# Patient Record
Sex: Female | Born: 1950 | Race: White | Hispanic: No | Marital: Married
Health system: Southern US, Community
[De-identification: ages and names within clinical notes are randomized; demographics above are authoritative.]

---

## 2003-11-26 ENCOUNTER — Ambulatory Visit: Payer: Self-pay | Admitting: Internal Medicine

## 2005-04-01 ENCOUNTER — Ambulatory Visit: Payer: Self-pay | Admitting: Internal Medicine

## 2006-04-10 ENCOUNTER — Ambulatory Visit: Payer: Self-pay | Admitting: Internal Medicine

## 2006-04-13 ENCOUNTER — Ambulatory Visit: Payer: Self-pay | Admitting: Internal Medicine

## 2007-06-27 ENCOUNTER — Ambulatory Visit: Payer: Self-pay | Admitting: Internal Medicine

## 2009-02-23 ENCOUNTER — Ambulatory Visit: Payer: Self-pay | Admitting: Internal Medicine

## 2009-03-26 ENCOUNTER — Ambulatory Visit: Payer: Self-pay | Admitting: Gastroenterology

## 2011-03-08 ENCOUNTER — Ambulatory Visit: Payer: Self-pay | Admitting: Internal Medicine

## 2012-03-20 ENCOUNTER — Ambulatory Visit: Payer: Self-pay | Admitting: Internal Medicine

## 2013-04-24 ENCOUNTER — Ambulatory Visit: Payer: Self-pay | Admitting: Otolaryngology

## 2013-04-25 ENCOUNTER — Ambulatory Visit: Payer: Self-pay | Admitting: Oncology

## 2013-04-25 LAB — CBC CANCER CENTER
BASOS PCT: 0.5 %
Basophil #: 0 x10 3/mm (ref 0.0–0.1)
EOS ABS: 0.1 x10 3/mm (ref 0.0–0.7)
Eosinophil %: 0.9 %
HCT: 39.1 % (ref 35.0–47.0)
HGB: 12.8 g/dL (ref 12.0–16.0)
LYMPHS PCT: 23.6 %
Lymphocyte #: 2 x10 3/mm (ref 1.0–3.6)
MCH: 28.2 pg (ref 26.0–34.0)
MCHC: 32.8 g/dL (ref 32.0–36.0)
MCV: 86 fL (ref 80–100)
MONOS PCT: 7.3 %
Monocyte #: 0.6 x10 3/mm (ref 0.2–0.9)
NEUTROS PCT: 67.7 %
Neutrophil #: 5.7 x10 3/mm (ref 1.4–6.5)
Platelet: 389 x10 3/mm (ref 150–440)
RBC: 4.55 10*6/uL (ref 3.80–5.20)
RDW: 15 % — ABNORMAL HIGH (ref 11.5–14.5)
WBC: 8.4 x10 3/mm (ref 3.6–11.0)

## 2013-04-25 LAB — COMPREHENSIVE METABOLIC PANEL
ALBUMIN: 3.6 g/dL (ref 3.4–5.0)
ALK PHOS: 96 U/L
ALT: 19 U/L (ref 12–78)
Anion Gap: 10 (ref 7–16)
BILIRUBIN TOTAL: 0.5 mg/dL (ref 0.2–1.0)
BUN: 14 mg/dL (ref 7–18)
CALCIUM: 10.2 mg/dL — AB (ref 8.5–10.1)
CREATININE: 1.16 mg/dL (ref 0.60–1.30)
Chloride: 102 mmol/L (ref 98–107)
Co2: 30 mmol/L (ref 21–32)
EGFR (African American): 59 — ABNORMAL LOW
EGFR (Non-African Amer.): 51 — ABNORMAL LOW
Glucose: 99 mg/dL (ref 65–99)
Osmolality: 284 (ref 275–301)
POTASSIUM: 3.6 mmol/L (ref 3.5–5.1)
SGOT(AST): 16 U/L (ref 15–37)
Sodium: 142 mmol/L (ref 136–145)
Total Protein: 7.8 g/dL (ref 6.4–8.2)

## 2013-04-26 LAB — CEA: CEA: 32.8 ng/mL — ABNORMAL HIGH (ref 0.0–4.7)

## 2013-04-29 ENCOUNTER — Ambulatory Visit: Payer: Self-pay | Admitting: Oncology

## 2013-05-02 LAB — CBC CANCER CENTER
Basophil #: 0 x10 3/mm (ref 0.0–0.1)
Basophil %: 0.1 %
EOS ABS: 0 x10 3/mm (ref 0.0–0.7)
EOS PCT: 0 %
HCT: 37.8 % (ref 35.0–47.0)
HGB: 12.2 g/dL (ref 12.0–16.0)
Lymphocyte #: 1.3 x10 3/mm (ref 1.0–3.6)
Lymphocyte %: 7.3 %
MCH: 28 pg (ref 26.0–34.0)
MCHC: 32.2 g/dL (ref 32.0–36.0)
MCV: 87 fL (ref 80–100)
Monocyte #: 0.7 x10 3/mm (ref 0.2–0.9)
Monocyte %: 3.9 %
NEUTROS ABS: 16.1 x10 3/mm — AB (ref 1.4–6.5)
Neutrophil %: 88.7 %
Platelet: 424 x10 3/mm (ref 150–440)
RBC: 4.36 10*6/uL (ref 3.80–5.20)
RDW: 15 % — AB (ref 11.5–14.5)
WBC: 18.2 x10 3/mm — AB (ref 3.6–11.0)

## 2013-05-06 ENCOUNTER — Ambulatory Visit: Payer: Self-pay

## 2013-05-06 LAB — PROTIME-INR
INR: 1
PROTHROMBIN TIME: 12.8 s (ref 11.5–14.7)

## 2013-05-06 LAB — APTT: Activated PTT: 25.7 secs (ref 23.6–35.9)

## 2013-05-15 LAB — CBC CANCER CENTER
Basophil #: 0.1 x10 3/mm (ref 0.0–0.1)
Basophil %: 0.7 %
EOS PCT: 0 %
Eosinophil #: 0 x10 3/mm (ref 0.0–0.7)
HCT: 36.5 % (ref 35.0–47.0)
HGB: 11.9 g/dL — ABNORMAL LOW (ref 12.0–16.0)
Lymphocyte #: 0.8 x10 3/mm — ABNORMAL LOW (ref 1.0–3.6)
Lymphocyte %: 5.6 %
MCH: 28.4 pg (ref 26.0–34.0)
MCHC: 32.6 g/dL (ref 32.0–36.0)
MCV: 87 fL (ref 80–100)
Monocyte #: 0.5 x10 3/mm (ref 0.2–0.9)
Monocyte %: 3.8 %
NEUTROS PCT: 89.9 %
Neutrophil #: 12.5 x10 3/mm — ABNORMAL HIGH (ref 1.4–6.5)
PLATELETS: 295 x10 3/mm (ref 150–440)
RBC: 4.2 10*6/uL (ref 3.80–5.20)
RDW: 16.9 % — ABNORMAL HIGH (ref 11.5–14.5)
WBC: 13.9 x10 3/mm — AB (ref 3.6–11.0)

## 2013-05-17 ENCOUNTER — Ambulatory Visit: Payer: Self-pay | Admitting: Oncology

## 2013-05-29 LAB — COMPREHENSIVE METABOLIC PANEL
Albumin: 3.2 g/dL — ABNORMAL LOW (ref 3.4–5.0)
Alkaline Phosphatase: 103 U/L
Anion Gap: 11 (ref 7–16)
BUN: 28 mg/dL — ABNORMAL HIGH (ref 7–18)
Bilirubin,Total: 0.5 mg/dL (ref 0.2–1.0)
CHLORIDE: 103 mmol/L (ref 98–107)
CO2: 28 mmol/L (ref 21–32)
CREATININE: 1.17 mg/dL (ref 0.60–1.30)
Calcium, Total: 8.8 mg/dL (ref 8.5–10.1)
EGFR (African American): 58 — ABNORMAL LOW
GFR CALC NON AF AMER: 50 — AB
GLUCOSE: 173 mg/dL — AB (ref 65–99)
Osmolality: 293 (ref 275–301)
Potassium: 3.5 mmol/L (ref 3.5–5.1)
SGOT(AST): 18 U/L (ref 15–37)
SGPT (ALT): 55 U/L (ref 12–78)
Sodium: 142 mmol/L (ref 136–145)
Total Protein: 6.6 g/dL (ref 6.4–8.2)

## 2013-05-29 LAB — CBC CANCER CENTER
Basophil #: 0.1 x10 3/mm (ref 0.0–0.1)
Basophil %: 0.4 %
EOS PCT: 0.3 %
Eosinophil #: 0.1 x10 3/mm (ref 0.0–0.7)
HCT: 39.6 % (ref 35.0–47.0)
HGB: 13 g/dL (ref 12.0–16.0)
Lymphocyte #: 1.1 x10 3/mm (ref 1.0–3.6)
Lymphocyte %: 6.2 %
MCH: 29.2 pg (ref 26.0–34.0)
MCHC: 32.9 g/dL (ref 32.0–36.0)
MCV: 89 fL (ref 80–100)
MONOS PCT: 4.2 %
Monocyte #: 0.8 x10 3/mm (ref 0.2–0.9)
Neutrophil #: 16.3 x10 3/mm — ABNORMAL HIGH (ref 1.4–6.5)
Neutrophil %: 88.9 %
Platelet: 325 x10 3/mm (ref 150–440)
RBC: 4.45 10*6/uL (ref 3.80–5.20)
RDW: 18.1 % — ABNORMAL HIGH (ref 11.5–14.5)
WBC: 18.3 x10 3/mm — AB (ref 3.6–11.0)

## 2013-05-31 LAB — PATHOLOGY REPORT

## 2013-06-05 LAB — CBC CANCER CENTER
BASOS ABS: 0 x10 3/mm (ref 0.0–0.1)
Basophil %: 0.2 %
EOS ABS: 0.2 x10 3/mm (ref 0.0–0.7)
EOS PCT: 2.6 %
HCT: 38.9 % (ref 35.0–47.0)
HGB: 12.9 g/dL (ref 12.0–16.0)
Lymphocyte #: 1.4 x10 3/mm (ref 1.0–3.6)
Lymphocyte %: 17.4 %
MCH: 29.1 pg (ref 26.0–34.0)
MCHC: 33 g/dL (ref 32.0–36.0)
MCV: 88 fL (ref 80–100)
MONO ABS: 0.4 x10 3/mm (ref 0.2–0.9)
MONOS PCT: 4.8 %
NEUTROS ABS: 6.2 x10 3/mm (ref 1.4–6.5)
NEUTROS PCT: 75 %
PLATELETS: 225 x10 3/mm (ref 150–440)
RBC: 4.42 10*6/uL (ref 3.80–5.20)
RDW: 18.1 % — ABNORMAL HIGH (ref 11.5–14.5)
WBC: 8.2 x10 3/mm (ref 3.6–11.0)

## 2013-06-07 LAB — CBC WITH DIFFERENTIAL/PLATELET
Basophil #: 0 10*3/uL (ref 0.0–0.1)
Basophil %: 0.1 %
EOS PCT: 0.4 %
Eosinophil #: 0 10*3/uL (ref 0.0–0.7)
HCT: 35.8 % (ref 35.0–47.0)
HGB: 12.1 g/dL (ref 12.0–16.0)
Lymphocyte #: 0.5 10*3/uL — ABNORMAL LOW (ref 1.0–3.6)
Lymphocyte %: 8.3 %
MCH: 29.7 pg (ref 26.0–34.0)
MCHC: 33.7 g/dL (ref 32.0–36.0)
MCV: 88 fL (ref 80–100)
Monocyte #: 0.7 x10 3/mm (ref 0.2–0.9)
Monocyte %: 11.6 %
Neutrophil #: 4.6 10*3/uL (ref 1.4–6.5)
Neutrophil %: 79.6 %
Platelet: 165 10*3/uL (ref 150–440)
RBC: 4.07 10*6/uL (ref 3.80–5.20)
RDW: 17.6 % — ABNORMAL HIGH (ref 11.5–14.5)
WBC: 5.7 10*3/uL (ref 3.6–11.0)

## 2013-06-07 LAB — COMPREHENSIVE METABOLIC PANEL
ALT: 35 U/L (ref 12–78)
ANION GAP: 9 (ref 7–16)
Albumin: 2.9 g/dL — ABNORMAL LOW (ref 3.4–5.0)
Alkaline Phosphatase: 91 U/L
BILIRUBIN TOTAL: 0.6 mg/dL (ref 0.2–1.0)
BUN: 16 mg/dL (ref 7–18)
CALCIUM: 8.7 mg/dL (ref 8.5–10.1)
CHLORIDE: 99 mmol/L (ref 98–107)
CO2: 26 mmol/L (ref 21–32)
CREATININE: 1.2 mg/dL (ref 0.60–1.30)
EGFR (African American): 56 — ABNORMAL LOW
EGFR (Non-African Amer.): 49 — ABNORMAL LOW
Glucose: 101 mg/dL — ABNORMAL HIGH (ref 65–99)
OSMOLALITY: 270 (ref 275–301)
Potassium: 2.9 mmol/L — ABNORMAL LOW (ref 3.5–5.1)
SGOT(AST): 22 U/L (ref 15–37)
SODIUM: 134 mmol/L — AB (ref 136–145)
Total Protein: 6.4 g/dL (ref 6.4–8.2)

## 2013-06-08 ENCOUNTER — Inpatient Hospital Stay: Payer: Self-pay | Admitting: Internal Medicine

## 2013-06-08 LAB — URINALYSIS, COMPLETE
Bilirubin,UR: NEGATIVE
Glucose,UR: NEGATIVE mg/dL (ref 0–75)
Ketone: NEGATIVE
NITRITE: POSITIVE
Ph: 6 (ref 4.5–8.0)
Protein: NEGATIVE
RBC,UR: 3 /HPF (ref 0–5)
SPECIFIC GRAVITY: 1.012 (ref 1.003–1.030)
Squamous Epithelial: <1

## 2013-06-08 LAB — MAGNESIUM: MAGNESIUM: 1.7 mg/dL — AB

## 2013-06-08 LAB — POTASSIUM: POTASSIUM: 3.6 mmol/L (ref 3.5–5.1)

## 2013-06-09 LAB — CBC WITH DIFFERENTIAL/PLATELET
BASOS ABS: 0 10*3/uL (ref 0.0–0.1)
Basophil %: 0.1 %
Eosinophil #: 0 10*3/uL (ref 0.0–0.7)
Eosinophil %: 0 %
HCT: 32.5 % — AB (ref 35.0–47.0)
HGB: 10.7 g/dL — ABNORMAL LOW (ref 12.0–16.0)
Lymphocyte #: 0.5 10*3/uL — ABNORMAL LOW (ref 1.0–3.6)
Lymphocyte %: 6.3 %
MCH: 29.1 pg (ref 26.0–34.0)
MCHC: 33 g/dL (ref 32.0–36.0)
MCV: 88 fL (ref 80–100)
Monocyte #: 0.4 x10 3/mm (ref 0.2–0.9)
Monocyte %: 5.5 %
NEUTROS PCT: 88.1 %
Neutrophil #: 6.9 10*3/uL — ABNORMAL HIGH (ref 1.4–6.5)
PLATELETS: 167 10*3/uL (ref 150–440)
RBC: 3.69 10*6/uL — ABNORMAL LOW (ref 3.80–5.20)
RDW: 17.5 % — ABNORMAL HIGH (ref 11.5–14.5)
WBC: 7.9 10*3/uL (ref 3.6–11.0)

## 2013-06-09 LAB — COMPREHENSIVE METABOLIC PANEL
ANION GAP: 2 — AB (ref 7–16)
Albumin: 2.2 g/dL — ABNORMAL LOW (ref 3.4–5.0)
Alkaline Phosphatase: 88 U/L
BUN: 13 mg/dL (ref 7–18)
Bilirubin,Total: 0.3 mg/dL (ref 0.2–1.0)
CALCIUM: 8.8 mg/dL (ref 8.5–10.1)
Chloride: 106 mmol/L (ref 98–107)
Co2: 27 mmol/L (ref 21–32)
Creatinine: 1.07 mg/dL (ref 0.60–1.30)
EGFR (African American): >60
EGFR (Non-African Amer.): 56 — ABNORMAL LOW
GLUCOSE: 136 mg/dL — AB (ref 65–99)
Osmolality: 272 (ref 275–301)
Potassium: 3.6 mmol/L (ref 3.5–5.1)
SGOT(AST): 25 U/L (ref 15–37)
SGPT (ALT): 33 U/L (ref 12–78)
Sodium: 135 mmol/L — ABNORMAL LOW (ref 136–145)
Total Protein: 5.8 g/dL — ABNORMAL LOW (ref 6.4–8.2)

## 2013-06-09 LAB — MAGNESIUM: Magnesium: 2 mg/dL

## 2013-06-10 LAB — URINE CULTURE

## 2013-06-12 LAB — CBC CANCER CENTER
Basophil #: 0 x10 3/mm (ref 0.0–0.1)
Basophil %: 0.4 %
Eosinophil #: 0 x10 3/mm (ref 0.0–0.7)
Eosinophil %: 0.1 %
HCT: 36.5 % (ref 35.0–47.0)
HGB: 11.9 g/dL — ABNORMAL LOW (ref 12.0–16.0)
Lymphocyte #: 1 x10 3/mm (ref 1.0–3.6)
Lymphocyte %: 9.4 %
MCH: 29 pg (ref 26.0–34.0)
MCHC: 32.7 g/dL (ref 32.0–36.0)
MCV: 89 fL (ref 80–100)
MONO ABS: 1 x10 3/mm — AB (ref 0.2–0.9)
Monocyte %: 9 %
NEUTROS PCT: 81.1 %
Neutrophil #: 8.9 x10 3/mm — ABNORMAL HIGH (ref 1.4–6.5)
PLATELETS: 280 x10 3/mm (ref 150–440)
RBC: 4.12 10*6/uL (ref 3.80–5.20)
RDW: 17.6 % — AB (ref 11.5–14.5)
WBC: 11 x10 3/mm (ref 3.6–11.0)

## 2013-06-12 LAB — CULTURE, BLOOD (SINGLE)

## 2013-06-13 LAB — CULTURE, BLOOD (SINGLE)

## 2013-06-17 ENCOUNTER — Ambulatory Visit: Payer: Self-pay | Admitting: Oncology

## 2013-06-19 LAB — COMPREHENSIVE METABOLIC PANEL
ALBUMIN: 2.9 g/dL — AB (ref 3.4–5.0)
ALK PHOS: 117 U/L
ALT: 71 U/L (ref 12–78)
Anion Gap: 10 (ref 7–16)
BILIRUBIN TOTAL: 0.3 mg/dL (ref 0.2–1.0)
BUN: 26 mg/dL — ABNORMAL HIGH (ref 7–18)
CALCIUM: 9.3 mg/dL (ref 8.5–10.1)
CO2: 28 mmol/L (ref 21–32)
Chloride: 101 mmol/L (ref 98–107)
Creatinine: 1.13 mg/dL (ref 0.60–1.30)
EGFR (African American): >60
EGFR (Non-African Amer.): 52 — ABNORMAL LOW
Glucose: 167 mg/dL — ABNORMAL HIGH (ref 65–99)
Osmolality: 286 (ref 275–301)
POTASSIUM: 4 mmol/L (ref 3.5–5.1)
SGOT(AST): 26 U/L (ref 15–37)
Sodium: 139 mmol/L (ref 136–145)
Total Protein: 6.6 g/dL (ref 6.4–8.2)

## 2013-06-19 LAB — CBC CANCER CENTER
Basophil #: 0.1 x10 3/mm (ref 0.0–0.1)
Basophil %: 0.6 %
EOS ABS: 0 x10 3/mm (ref 0.0–0.7)
EOS PCT: 0 %
HCT: 37.9 % (ref 35.0–47.0)
HGB: 12.3 g/dL (ref 12.0–16.0)
Lymphocyte #: 0.9 x10 3/mm — ABNORMAL LOW (ref 1.0–3.6)
Lymphocyte %: 4.5 %
MCH: 28.8 pg (ref 26.0–34.0)
MCHC: 32.4 g/dL (ref 32.0–36.0)
MCV: 89 fL (ref 80–100)
MONOS PCT: 4.7 %
Monocyte #: 0.9 x10 3/mm (ref 0.2–0.9)
NEUTROS ABS: 18.3 x10 3/mm — AB (ref 1.4–6.5)
Neutrophil %: 90.2 %
PLATELETS: 477 x10 3/mm — AB (ref 150–440)
RBC: 4.27 10*6/uL (ref 3.80–5.20)
RDW: 18.7 % — ABNORMAL HIGH (ref 11.5–14.5)
WBC: 20.3 x10 3/mm — AB (ref 3.6–11.0)

## 2013-06-28 LAB — URINALYSIS, COMPLETE
BLOOD: NEGATIVE
Bacteria: NONE SEEN
Bilirubin,UR: NEGATIVE
GLUCOSE, UR: NEGATIVE mg/dL (ref 0–75)
Ketone: NEGATIVE
NITRITE: NEGATIVE
PH: 7 (ref 4.5–8.0)
Protein: NEGATIVE
SPECIFIC GRAVITY: 1.016 (ref 1.003–1.030)
Squamous Epithelial: 2
WBC UR: 13 /HPF (ref 0–5)

## 2013-06-29 LAB — URINE CULTURE

## 2013-07-10 LAB — CBC CANCER CENTER
Basophil #: 0.1 x10 3/mm (ref 0.0–0.1)
Basophil %: 0.5 %
Eosinophil #: 0 x10 3/mm (ref 0.0–0.7)
Eosinophil %: 0.2 %
HCT: 35.1 % (ref 35.0–47.0)
HGB: 11.9 g/dL — ABNORMAL LOW (ref 12.0–16.0)
LYMPHS ABS: 1.5 x10 3/mm (ref 1.0–3.6)
LYMPHS PCT: 15.8 %
MCH: 30.3 pg (ref 26.0–34.0)
MCHC: 33.9 g/dL (ref 32.0–36.0)
MCV: 90 fL (ref 80–100)
MONOS PCT: 7.2 %
Monocyte #: 0.7 x10 3/mm (ref 0.2–0.9)
NEUTROS PCT: 76.3 %
Neutrophil #: 7.4 x10 3/mm — ABNORMAL HIGH (ref 1.4–6.5)
PLATELETS: 265 x10 3/mm (ref 150–440)
RBC: 3.92 10*6/uL (ref 3.80–5.20)
RDW: 20.7 % — ABNORMAL HIGH (ref 11.5–14.5)
WBC: 9.7 x10 3/mm (ref 3.6–11.0)

## 2013-07-10 LAB — COMPREHENSIVE METABOLIC PANEL
Albumin: 2.9 g/dL — ABNORMAL LOW (ref 3.4–5.0)
Alkaline Phosphatase: 96 U/L
Anion Gap: 8 (ref 7–16)
BUN: 24 mg/dL — ABNORMAL HIGH (ref 7–18)
Bilirubin,Total: 0.4 mg/dL (ref 0.2–1.0)
CREATININE: 1.02 mg/dL (ref 0.60–1.30)
Calcium, Total: 9.3 mg/dL (ref 8.5–10.1)
Chloride: 101 mmol/L (ref 98–107)
Co2: 29 mmol/L (ref 21–32)
EGFR (African American): >60
EGFR (Non-African Amer.): 59 — ABNORMAL LOW
GLUCOSE: 154 mg/dL — AB (ref 65–99)
OSMOLALITY: 283 (ref 275–301)
Potassium: 3.2 mmol/L — ABNORMAL LOW (ref 3.5–5.1)
SGOT(AST): 27 U/L (ref 15–37)
SGPT (ALT): 52 U/L (ref 12–78)
Sodium: 138 mmol/L (ref 136–145)
Total Protein: 6.3 g/dL — ABNORMAL LOW (ref 6.4–8.2)

## 2013-07-17 ENCOUNTER — Ambulatory Visit: Payer: Self-pay | Admitting: Oncology

## 2013-07-18 LAB — COMPREHENSIVE METABOLIC PANEL
ALT: 58 U/L (ref 12–78)
AST: 30 U/L (ref 15–37)
Albumin: 3.2 g/dL — ABNORMAL LOW (ref 3.4–5.0)
Alkaline Phosphatase: 96 U/L
Anion Gap: 11 (ref 7–16)
BUN: 24 mg/dL — ABNORMAL HIGH (ref 7–18)
Bilirubin,Total: 0.5 mg/dL (ref 0.2–1.0)
CALCIUM: 8.7 mg/dL (ref 8.5–10.1)
CHLORIDE: 101 mmol/L (ref 98–107)
Co2: 28 mmol/L (ref 21–32)
Creatinine: 1.13 mg/dL (ref 0.60–1.30)
EGFR (Non-African Amer.): 52 — ABNORMAL LOW
Glucose: 109 mg/dL — ABNORMAL HIGH (ref 65–99)
Osmolality: 284 (ref 275–301)
Potassium: 3.4 mmol/L — ABNORMAL LOW (ref 3.5–5.1)
Sodium: 140 mmol/L (ref 136–145)
TOTAL PROTEIN: 7.2 g/dL (ref 6.4–8.2)

## 2013-07-18 LAB — CBC CANCER CENTER
BASOS PCT: 0.2 %
Basophil #: 0 x10 3/mm (ref 0.0–0.1)
EOS ABS: 0 x10 3/mm (ref 0.0–0.7)
Eosinophil %: 0.1 %
HCT: 37.7 % (ref 35.0–47.0)
HGB: 12.7 g/dL (ref 12.0–16.0)
LYMPHS PCT: 21.8 %
Lymphocyte #: 1.4 x10 3/mm (ref 1.0–3.6)
MCH: 30.6 pg (ref 26.0–34.0)
MCHC: 33.7 g/dL (ref 32.0–36.0)
MCV: 91 fL (ref 80–100)
MONO ABS: 0.7 x10 3/mm (ref 0.2–0.9)
Monocyte %: 11.5 %
NEUTROS ABS: 4.3 x10 3/mm (ref 1.4–6.5)
Neutrophil %: 66.4 %
Platelet: 155 x10 3/mm (ref 150–440)
RBC: 4.15 10*6/uL (ref 3.80–5.20)
RDW: 19.8 % — ABNORMAL HIGH (ref 11.5–14.5)
WBC: 6.5 x10 3/mm (ref 3.6–11.0)

## 2013-07-30 ENCOUNTER — Encounter: Payer: Self-pay | Admitting: Oncology

## 2013-08-05 LAB — CBC CANCER CENTER
BASOS ABS: 0.1 x10 3/mm (ref 0.0–0.1)
Basophil %: 0.3 %
EOS ABS: 0 x10 3/mm (ref 0.0–0.7)
Eosinophil %: 0.1 %
HCT: 35.6 % (ref 35.0–47.0)
HGB: 11.5 g/dL — ABNORMAL LOW (ref 12.0–16.0)
Lymphocyte #: 2.3 x10 3/mm (ref 1.0–3.6)
Lymphocyte %: 11.9 %
MCH: 31.2 pg (ref 26.0–34.0)
MCHC: 32.4 g/dL (ref 32.0–36.0)
MCV: 96 fL (ref 80–100)
MONO ABS: 1.2 x10 3/mm — AB (ref 0.2–0.9)
Monocyte %: 6 %
Neutrophil #: 15.7 x10 3/mm — ABNORMAL HIGH (ref 1.4–6.5)
Neutrophil %: 81.7 %
Platelet: 266 x10 3/mm (ref 150–440)
RBC: 3.69 10*6/uL — AB (ref 3.80–5.20)
RDW: 23 % — ABNORMAL HIGH (ref 11.5–14.5)
WBC: 19.3 x10 3/mm — AB (ref 3.6–11.0)

## 2013-08-05 LAB — COMPREHENSIVE METABOLIC PANEL
ANION GAP: 10 (ref 7–16)
Albumin: 2.9 g/dL — ABNORMAL LOW (ref 3.4–5.0)
Alkaline Phosphatase: 84 U/L
BUN: 27 mg/dL — AB (ref 7–18)
Bilirubin,Total: 0.4 mg/dL (ref 0.2–1.0)
Calcium, Total: 9.2 mg/dL (ref 8.5–10.1)
Chloride: 104 mmol/L (ref 98–107)
Co2: 27 mmol/L (ref 21–32)
Creatinine: 1.01 mg/dL (ref 0.60–1.30)
EGFR (African American): >60
Glucose: 150 mg/dL — ABNORMAL HIGH (ref 65–99)
Osmolality: 289 (ref 275–301)
Potassium: 3.5 mmol/L (ref 3.5–5.1)
SGOT(AST): 34 U/L (ref 15–37)
SGPT (ALT): 67 U/L (ref 12–78)
Sodium: 141 mmol/L (ref 136–145)
Total Protein: 6.3 g/dL — ABNORMAL LOW (ref 6.4–8.2)

## 2013-08-17 ENCOUNTER — Ambulatory Visit: Payer: Self-pay | Admitting: Oncology

## 2013-08-17 ENCOUNTER — Encounter: Payer: Self-pay | Admitting: Oncology

## 2013-08-20 LAB — CBC CANCER CENTER
Basophil #: 0 x10 3/mm (ref 0.0–0.1)
Basophil %: 0.3 %
Eosinophil #: 0 x10 3/mm (ref 0.0–0.7)
Eosinophil %: 0.2 %
HCT: 34.6 % — ABNORMAL LOW (ref 35.0–47.0)
HGB: 11.6 g/dL — AB (ref 12.0–16.0)
Lymphocyte #: 1.6 x10 3/mm (ref 1.0–3.6)
Lymphocyte %: 21.7 %
MCH: 32.9 pg (ref 26.0–34.0)
MCHC: 33.5 g/dL (ref 32.0–36.0)
MCV: 98 fL (ref 80–100)
MONOS PCT: 10.2 %
Monocyte #: 0.7 x10 3/mm (ref 0.2–0.9)
Neutrophil #: 4.9 x10 3/mm (ref 1.4–6.5)
Neutrophil %: 67.6 %
PLATELETS: 183 x10 3/mm (ref 150–440)
RBC: 3.52 10*6/uL — ABNORMAL LOW (ref 3.80–5.20)
RDW: 23 % — ABNORMAL HIGH (ref 11.5–14.5)
WBC: 7.2 x10 3/mm (ref 3.6–11.0)

## 2013-08-20 LAB — COMPREHENSIVE METABOLIC PANEL
ALBUMIN: 3.2 g/dL — AB (ref 3.4–5.0)
ALK PHOS: 82 U/L
ALT: 67 U/L — AB
AST: 36 U/L (ref 15–37)
Anion Gap: 11 (ref 7–16)
BUN: 27 mg/dL — ABNORMAL HIGH (ref 7–18)
Bilirubin,Total: 0.4 mg/dL (ref 0.2–1.0)
CO2: 24 mmol/L (ref 21–32)
Calcium, Total: 8.9 mg/dL (ref 8.5–10.1)
Chloride: 106 mmol/L (ref 98–107)
Creatinine: 1.08 mg/dL (ref 0.60–1.30)
EGFR (African American): >60
EGFR (Non-African Amer.): 55 — ABNORMAL LOW
Glucose: 100 mg/dL — ABNORMAL HIGH (ref 65–99)
Osmolality: 286 (ref 275–301)
Potassium: 3.6 mmol/L (ref 3.5–5.1)
Sodium: 141 mmol/L (ref 136–145)
Total Protein: 6.8 g/dL (ref 6.4–8.2)

## 2013-08-20 LAB — PROTIME-INR
INR: 1
Prothrombin Time: 12.7 secs (ref 11.5–14.7)

## 2013-08-20 LAB — APTT: Activated PTT: 27.8 secs (ref 23.6–35.9)

## 2013-09-02 LAB — COMPREHENSIVE METABOLIC PANEL
ALT: 49 U/L
ANION GAP: 11 (ref 7–16)
AST: 26 U/L (ref 15–37)
Albumin: 3 g/dL — ABNORMAL LOW (ref 3.4–5.0)
Alkaline Phosphatase: 77 U/L
BILIRUBIN TOTAL: 0.4 mg/dL (ref 0.2–1.0)
BUN: 32 mg/dL — AB (ref 7–18)
CALCIUM: 8.7 mg/dL (ref 8.5–10.1)
CREATININE: 1.02 mg/dL (ref 0.60–1.30)
Chloride: 105 mmol/L (ref 98–107)
Co2: 27 mmol/L (ref 21–32)
GFR CALC NON AF AMER: 59 — AB
Glucose: 170 mg/dL — ABNORMAL HIGH (ref 65–99)
Osmolality: 296 (ref 275–301)
POTASSIUM: 2.9 mmol/L — AB (ref 3.5–5.1)
SODIUM: 143 mmol/L (ref 136–145)
Total Protein: 6.3 g/dL — ABNORMAL LOW (ref 6.4–8.2)

## 2013-09-02 LAB — CBC CANCER CENTER
BASOS PCT: 0.2 %
Basophil #: 0 x10 3/mm (ref 0.0–0.1)
EOS ABS: 0 x10 3/mm (ref 0.0–0.7)
Eosinophil %: 0.2 %
HCT: 35.9 % (ref 35.0–47.0)
HGB: 11.5 g/dL — ABNORMAL LOW (ref 12.0–16.0)
LYMPHS PCT: 19.8 %
Lymphocyte #: 2.6 x10 3/mm (ref 1.0–3.6)
MCH: 32.8 pg (ref 26.0–34.0)
MCHC: 32.1 g/dL (ref 32.0–36.0)
MCV: 102 fL — ABNORMAL HIGH (ref 80–100)
Monocyte #: 0.8 x10 3/mm (ref 0.2–0.9)
Monocyte %: 5.7 %
Neutrophil #: 9.9 x10 3/mm — ABNORMAL HIGH (ref 1.4–6.5)
Neutrophil %: 74.1 %
Platelet: 286 x10 3/mm (ref 150–440)
RBC: 3.51 10*6/uL — ABNORMAL LOW (ref 3.80–5.20)
RDW: 22.5 % — AB (ref 11.5–14.5)
WBC: 13.4 x10 3/mm — ABNORMAL HIGH (ref 3.6–11.0)

## 2013-09-02 LAB — MAGNESIUM: Magnesium: 1.7 mg/dL — ABNORMAL LOW

## 2013-09-09 LAB — CBC CANCER CENTER
Basophil #: 0 x10 3/mm (ref 0.0–0.1)
Basophil %: 0.3 %
Eosinophil #: 0 x10 3/mm (ref 0.0–0.7)
Eosinophil %: 0.4 %
HCT: 37.7 % (ref 35.0–47.0)
HGB: 12.7 g/dL (ref 12.0–16.0)
LYMPHS ABS: 1.9 x10 3/mm (ref 1.0–3.6)
Lymphocyte %: 28 %
MCH: 34.7 pg — ABNORMAL HIGH (ref 26.0–34.0)
MCHC: 33.8 g/dL (ref 32.0–36.0)
MCV: 103 fL — ABNORMAL HIGH (ref 80–100)
Monocyte #: 0.5 x10 3/mm (ref 0.2–0.9)
Monocyte %: 7.5 %
NEUTROS PCT: 63.8 %
Neutrophil #: 4.3 x10 3/mm (ref 1.4–6.5)
Platelet: 263 x10 3/mm (ref 150–440)
RBC: 3.67 10*6/uL — AB (ref 3.80–5.20)
RDW: 21.4 % — AB (ref 11.5–14.5)
WBC: 6.8 x10 3/mm (ref 3.6–11.0)

## 2013-09-09 LAB — URINALYSIS, COMPLETE
Bilirubin,UR: NEGATIVE
Blood: NEGATIVE
Glucose,UR: NEGATIVE mg/dL (ref 0–75)
KETONE: NEGATIVE
Nitrite: NEGATIVE
PH: 6 (ref 4.5–8.0)
Protein: NEGATIVE
Specific Gravity: 1.005 (ref 1.003–1.030)

## 2013-09-10 LAB — BASIC METABOLIC PANEL
Anion Gap: 16 (ref 7–16)
BUN: 20 mg/dL — ABNORMAL HIGH (ref 7–18)
CALCIUM: 8.5 mg/dL (ref 8.5–10.1)
Chloride: 102 mmol/L (ref 98–107)
Co2: 21 mmol/L (ref 21–32)
Creatinine: 1.43 mg/dL — ABNORMAL HIGH (ref 0.60–1.30)
EGFR (African American): 46 — ABNORMAL LOW
EGFR (Non-African Amer.): 39 — ABNORMAL LOW
GLUCOSE: 140 mg/dL — AB (ref 65–99)
Osmolality: 282 (ref 275–301)
Potassium: 4 mmol/L (ref 3.5–5.1)
SODIUM: 139 mmol/L (ref 136–145)

## 2013-09-11 LAB — URINE CULTURE

## 2013-09-16 LAB — CBC CANCER CENTER
BASOS PCT: 0.2 %
Basophil #: 0 x10 3/mm (ref 0.0–0.1)
Eosinophil #: 0 x10 3/mm (ref 0.0–0.7)
Eosinophil %: 0.1 %
HCT: 37.4 % (ref 35.0–47.0)
HGB: 12.3 g/dL (ref 12.0–16.0)
Lymphocyte #: 1.2 x10 3/mm (ref 1.0–3.6)
Lymphocyte %: 16.3 %
MCH: 33.7 pg (ref 26.0–34.0)
MCHC: 32.9 g/dL (ref 32.0–36.0)
MCV: 102 fL — ABNORMAL HIGH (ref 80–100)
Monocyte #: 0.3 x10 3/mm (ref 0.2–0.9)
Monocyte %: 3.9 %
NEUTROS ABS: 6 x10 3/mm (ref 1.4–6.5)
Neutrophil %: 79.5 %
PLATELETS: 203 x10 3/mm (ref 150–440)
RBC: 3.66 10*6/uL — ABNORMAL LOW (ref 3.80–5.20)
RDW: 21.1 % — ABNORMAL HIGH (ref 11.5–14.5)
WBC: 7.6 x10 3/mm (ref 3.6–11.0)

## 2013-09-17 ENCOUNTER — Ambulatory Visit: Payer: Self-pay | Admitting: Oncology

## 2013-09-24 LAB — CBC CANCER CENTER
Basophil #: 0.1 x10 3/mm (ref 0.0–0.1)
Basophil %: 0.9 %
EOS PCT: 0.2 %
Eosinophil #: 0 x10 3/mm (ref 0.0–0.7)
HCT: 35.8 % (ref 35.0–47.0)
HGB: 11.9 g/dL — ABNORMAL LOW (ref 12.0–16.0)
LYMPHS PCT: 28.4 %
Lymphocyte #: 2.5 x10 3/mm (ref 1.0–3.6)
MCH: 34 pg (ref 26.0–34.0)
MCHC: 33.1 g/dL (ref 32.0–36.0)
MCV: 103 fL — AB (ref 80–100)
MONO ABS: 0.8 x10 3/mm (ref 0.2–0.9)
Monocyte %: 9.6 %
Neutrophil #: 5.3 x10 3/mm (ref 1.4–6.5)
Neutrophil %: 60.9 %
Platelet: 366 x10 3/mm (ref 150–440)
RBC: 3.49 10*6/uL — ABNORMAL LOW (ref 3.80–5.20)
RDW: 20.2 % — AB (ref 11.5–14.5)
WBC: 8.7 x10 3/mm (ref 3.6–11.0)

## 2013-09-24 LAB — COMPREHENSIVE METABOLIC PANEL
AST: 29 U/L (ref 15–37)
Albumin: 3 g/dL — ABNORMAL LOW (ref 3.4–5.0)
Alkaline Phosphatase: 83 U/L
Anion Gap: 11 (ref 7–16)
BUN: 17 mg/dL (ref 7–18)
Bilirubin,Total: 0.4 mg/dL (ref 0.2–1.0)
CO2: 25 mmol/L (ref 21–32)
Calcium, Total: 8.5 mg/dL (ref 8.5–10.1)
Chloride: 103 mmol/L (ref 98–107)
Creatinine: 1.4 mg/dL — ABNORMAL HIGH (ref 0.60–1.30)
EGFR (African American): 47 — ABNORMAL LOW
EGFR (Non-African Amer.): 40 — ABNORMAL LOW
GLUCOSE: 141 mg/dL — AB (ref 65–99)
Osmolality: 281 (ref 275–301)
Potassium: 2.9 mmol/L — ABNORMAL LOW (ref 3.5–5.1)
SGPT (ALT): 38 U/L
SODIUM: 139 mmol/L (ref 136–145)
TOTAL PROTEIN: 6.7 g/dL (ref 6.4–8.2)

## 2013-09-24 LAB — MAGNESIUM: MAGNESIUM: 1.8 mg/dL

## 2013-09-30 LAB — COMPREHENSIVE METABOLIC PANEL
ALT: 40 U/L
Albumin: 3.3 g/dL — ABNORMAL LOW (ref 3.4–5.0)
Alkaline Phosphatase: 87 U/L
Anion Gap: 10 (ref 7–16)
BUN: 19 mg/dL — AB (ref 7–18)
Bilirubin,Total: 0.7 mg/dL (ref 0.2–1.0)
CO2: 27 mmol/L (ref 21–32)
CREATININE: 1.53 mg/dL — AB (ref 0.60–1.30)
Calcium, Total: 9.4 mg/dL (ref 8.5–10.1)
Chloride: 102 mmol/L (ref 98–107)
EGFR (African American): 42 — ABNORMAL LOW
GFR CALC NON AF AMER: 36 — AB
GLUCOSE: 121 mg/dL — AB (ref 65–99)
OSMOLALITY: 281 (ref 275–301)
Potassium: 4.4 mmol/L (ref 3.5–5.1)
SGOT(AST): 35 U/L (ref 15–37)
SODIUM: 139 mmol/L (ref 136–145)
TOTAL PROTEIN: 7.3 g/dL (ref 6.4–8.2)

## 2013-09-30 LAB — CBC CANCER CENTER
BASOS ABS: 0.1 x10 3/mm (ref 0.0–0.1)
Basophil %: 0.9 %
Eosinophil #: 0 x10 3/mm (ref 0.0–0.7)
Eosinophil %: 0.2 %
HCT: 38.5 % (ref 35.0–47.0)
HGB: 12.6 g/dL (ref 12.0–16.0)
Lymphocyte #: 1.1 x10 3/mm (ref 1.0–3.6)
Lymphocyte %: 12.9 %
MCH: 33.6 pg (ref 26.0–34.0)
MCHC: 32.8 g/dL (ref 32.0–36.0)
MCV: 102 fL — ABNORMAL HIGH (ref 80–100)
MONOS PCT: 2.7 %
Monocyte #: 0.2 x10 3/mm (ref 0.2–0.9)
NEUTROS ABS: 6.8 x10 3/mm — AB (ref 1.4–6.5)
NEUTROS PCT: 83.3 %
Platelet: 269 x10 3/mm (ref 150–440)
RBC: 3.77 10*6/uL — AB (ref 3.80–5.20)
RDW: 19.4 % — AB (ref 11.5–14.5)
WBC: 8.2 x10 3/mm (ref 3.6–11.0)

## 2013-10-08 LAB — CBC CANCER CENTER
BASOS PCT: 0.2 %
Basophil #: 0 x10 3/mm (ref 0.0–0.1)
Eosinophil #: 0 x10 3/mm (ref 0.0–0.7)
Eosinophil %: 0.4 %
HCT: 35.9 % (ref 35.0–47.0)
HGB: 11.7 g/dL — AB (ref 12.0–16.0)
LYMPHS ABS: 2.5 x10 3/mm (ref 1.0–3.6)
Lymphocyte %: 30.5 %
MCH: 33.6 pg (ref 26.0–34.0)
MCHC: 32.6 g/dL (ref 32.0–36.0)
MCV: 103 fL — ABNORMAL HIGH (ref 80–100)
MONOS PCT: 10.1 %
Monocyte #: 0.8 x10 3/mm (ref 0.2–0.9)
Neutrophil #: 4.8 x10 3/mm (ref 1.4–6.5)
Neutrophil %: 58.8 %
PLATELETS: 192 x10 3/mm (ref 150–440)
RBC: 3.48 10*6/uL — ABNORMAL LOW (ref 3.80–5.20)
RDW: 19.8 % — ABNORMAL HIGH (ref 11.5–14.5)
WBC: 8.2 x10 3/mm (ref 3.6–11.0)

## 2013-10-09 ENCOUNTER — Ambulatory Visit: Payer: Self-pay | Admitting: Oncology

## 2013-10-14 LAB — COMPREHENSIVE METABOLIC PANEL
ALT: 36 U/L
AST: 27 U/L (ref 15–37)
Albumin: 3.4 g/dL (ref 3.4–5.0)
Alkaline Phosphatase: 103 U/L
Anion Gap: 12 (ref 7–16)
BILIRUBIN TOTAL: 0.3 mg/dL (ref 0.2–1.0)
BUN: 16 mg/dL (ref 7–18)
CO2: 25 mmol/L (ref 21–32)
Calcium, Total: 9.2 mg/dL (ref 8.5–10.1)
Chloride: 98 mmol/L (ref 98–107)
Creatinine: 2.03 mg/dL — ABNORMAL HIGH (ref 0.60–1.30)
EGFR (Non-African Amer.): 26 — ABNORMAL LOW
GFR CALC AF AMER: 32 — AB
Glucose: 125 mg/dL — ABNORMAL HIGH (ref 65–99)
Osmolality: 273 (ref 275–301)
Potassium: 3.4 mmol/L — ABNORMAL LOW (ref 3.5–5.1)
SODIUM: 135 mmol/L — AB (ref 136–145)
Total Protein: 7.1 g/dL (ref 6.4–8.2)

## 2013-10-14 LAB — CBC CANCER CENTER
BASOS ABS: 0 x10 3/mm (ref 0.0–0.1)
Basophil %: 0.7 %
Eosinophil #: 0 x10 3/mm (ref 0.0–0.7)
Eosinophil %: 0.5 %
HCT: 37.4 % (ref 35.0–47.0)
HGB: 12.3 g/dL (ref 12.0–16.0)
Lymphocyte #: 2.2 x10 3/mm (ref 1.0–3.6)
Lymphocyte %: 32.1 %
MCH: 33.7 pg (ref 26.0–34.0)
MCHC: 33 g/dL (ref 32.0–36.0)
MCV: 102 fL — AB (ref 80–100)
MONOS PCT: 15.8 %
Monocyte #: 1.1 x10 3/mm — ABNORMAL HIGH (ref 0.2–0.9)
NEUTROS ABS: 3.4 x10 3/mm (ref 1.4–6.5)
Neutrophil %: 50.9 %
Platelet: 466 x10 3/mm — ABNORMAL HIGH (ref 150–440)
RBC: 3.66 10*6/uL — AB (ref 3.80–5.20)
RDW: 20 % — ABNORMAL HIGH (ref 11.5–14.5)
WBC: 6.8 x10 3/mm (ref 3.6–11.0)

## 2013-10-17 ENCOUNTER — Ambulatory Visit: Payer: Self-pay | Admitting: Oncology

## 2013-11-04 LAB — CBC CANCER CENTER
BASOS PCT: 0.6 %
Basophil #: 0 x10 3/mm (ref 0.0–0.1)
EOS ABS: 0.1 x10 3/mm (ref 0.0–0.7)
Eosinophil %: 0.9 %
HCT: 36.3 % (ref 35.0–47.0)
HGB: 11.7 g/dL — AB (ref 12.0–16.0)
LYMPHS ABS: 1.9 x10 3/mm (ref 1.0–3.6)
Lymphocyte %: 25.1 %
MCH: 32.4 pg (ref 26.0–34.0)
MCHC: 32.3 g/dL (ref 32.0–36.0)
MCV: 100 fL (ref 80–100)
MONO ABS: 0.8 x10 3/mm (ref 0.2–0.9)
Monocyte %: 10.7 %
Neutrophil #: 4.8 x10 3/mm (ref 1.4–6.5)
Neutrophil %: 62.7 %
Platelet: 352 x10 3/mm (ref 150–440)
RBC: 3.62 10*6/uL — ABNORMAL LOW (ref 3.80–5.20)
RDW: 18.6 % — ABNORMAL HIGH (ref 11.5–14.5)
WBC: 7.6 x10 3/mm (ref 3.6–11.0)

## 2013-11-04 LAB — COMPREHENSIVE METABOLIC PANEL
ALT: 17 U/L
Albumin: 2.8 g/dL — ABNORMAL LOW (ref 3.4–5.0)
Alkaline Phosphatase: 110 U/L
Anion Gap: 11 (ref 7–16)
BUN: 11 mg/dL (ref 7–18)
Bilirubin,Total: 0.4 mg/dL (ref 0.2–1.0)
CHLORIDE: 105 mmol/L (ref 98–107)
Calcium, Total: 8.5 mg/dL (ref 8.5–10.1)
Co2: 24 mmol/L (ref 21–32)
Creatinine: 1.57 mg/dL — ABNORMAL HIGH (ref 0.60–1.30)
GFR CALC AF AMER: 43 — AB
GFR CALC NON AF AMER: 35 — AB
GLUCOSE: 115 mg/dL — AB (ref 65–99)
OSMOLALITY: 280 (ref 275–301)
Potassium: 3.2 mmol/L — ABNORMAL LOW (ref 3.5–5.1)
SGOT(AST): 22 U/L (ref 15–37)
SODIUM: 140 mmol/L (ref 136–145)
TOTAL PROTEIN: 6.8 g/dL (ref 6.4–8.2)

## 2013-11-17 ENCOUNTER — Ambulatory Visit: Payer: Self-pay | Admitting: Oncology

## 2013-11-21 LAB — CBC CANCER CENTER
Basophil #: 0.1 x10 3/mm (ref 0.0–0.1)
Basophil %: 0.4 %
EOS PCT: 0.3 %
Eosinophil #: 0 x10 3/mm (ref 0.0–0.7)
HCT: 36 % (ref 35.0–47.0)
HGB: 11.5 g/dL — AB (ref 12.0–16.0)
Lymphocyte #: 1.6 x10 3/mm (ref 1.0–3.6)
Lymphocyte %: 11.9 %
MCH: 31.5 pg (ref 26.0–34.0)
MCHC: 31.9 g/dL — ABNORMAL LOW (ref 32.0–36.0)
MCV: 99 fL (ref 80–100)
MONO ABS: 1.2 x10 3/mm — AB (ref 0.2–0.9)
MONOS PCT: 8.8 %
NEUTROS ABS: 10.7 x10 3/mm — AB (ref 1.4–6.5)
Neutrophil %: 78.6 %
PLATELETS: 324 x10 3/mm (ref 150–440)
RBC: 3.63 10*6/uL — ABNORMAL LOW (ref 3.80–5.20)
RDW: 18 % — ABNORMAL HIGH (ref 11.5–14.5)
WBC: 13.6 x10 3/mm — AB (ref 3.6–11.0)

## 2013-11-21 LAB — COMPREHENSIVE METABOLIC PANEL
ALT: 25 U/L
ANION GAP: 12 (ref 7–16)
Albumin: 2.8 g/dL — ABNORMAL LOW (ref 3.4–5.0)
Alkaline Phosphatase: 88 U/L
BUN: 21 mg/dL — ABNORMAL HIGH (ref 7–18)
Bilirubin,Total: 0.3 mg/dL (ref 0.2–1.0)
CALCIUM: 9.2 mg/dL (ref 8.5–10.1)
CHLORIDE: 107 mmol/L (ref 98–107)
CREATININE: 1.55 mg/dL — AB (ref 0.60–1.30)
Co2: 25 mmol/L (ref 21–32)
GFR CALC AF AMER: 44 — AB
GFR CALC NON AF AMER: 36 — AB
Glucose: 92 mg/dL (ref 65–99)
Osmolality: 289 (ref 275–301)
Potassium: 3.7 mmol/L (ref 3.5–5.1)
SGOT(AST): 21 U/L (ref 15–37)
Sodium: 144 mmol/L (ref 136–145)
Total Protein: 6.3 g/dL — ABNORMAL LOW (ref 6.4–8.2)

## 2013-12-02 LAB — COMPREHENSIVE METABOLIC PANEL
ALK PHOS: 122 U/L — AB
Albumin: 3.1 g/dL — ABNORMAL LOW (ref 3.4–5.0)
Anion Gap: 14 (ref 7–16)
BUN: 20 mg/dL — AB (ref 7–18)
Bilirubin,Total: 0.5 mg/dL (ref 0.2–1.0)
CALCIUM: 8.9 mg/dL (ref 8.5–10.1)
CO2: 23 mmol/L (ref 21–32)
Chloride: 101 mmol/L (ref 98–107)
Creatinine: 1.84 mg/dL — ABNORMAL HIGH (ref 0.60–1.30)
GFR CALC AF AMER: 36 — AB
GFR CALC NON AF AMER: 30 — AB
GLUCOSE: 229 mg/dL — AB (ref 65–99)
OSMOLALITY: 286 (ref 275–301)
POTASSIUM: 4.2 mmol/L (ref 3.5–5.1)
SGOT(AST): 22 U/L (ref 15–37)
SGPT (ALT): 18 U/L
Sodium: 138 mmol/L (ref 136–145)
TOTAL PROTEIN: 7.3 g/dL (ref 6.4–8.2)

## 2013-12-02 LAB — CBC CANCER CENTER
Basophil #: 0 x10 3/mm (ref 0.0–0.1)
Basophil %: 0.3 %
EOS PCT: 0.1 %
Eosinophil #: 0 x10 3/mm (ref 0.0–0.7)
HCT: 39.7 % (ref 35.0–47.0)
HGB: 12.6 g/dL (ref 12.0–16.0)
LYMPHS ABS: 0.8 x10 3/mm — AB (ref 1.0–3.6)
Lymphocyte %: 11.5 %
MCH: 31 pg (ref 26.0–34.0)
MCHC: 31.9 g/dL — AB (ref 32.0–36.0)
MCV: 97 fL (ref 80–100)
MONO ABS: 0.2 x10 3/mm (ref 0.2–0.9)
Monocyte %: 2.5 %
Neutrophil #: 6.1 x10 3/mm (ref 1.4–6.5)
Neutrophil %: 85.6 %
Platelet: 294 x10 3/mm (ref 150–440)
RBC: 4.08 10*6/uL (ref 3.80–5.20)
RDW: 17.6 % — AB (ref 11.5–14.5)
WBC: 7.1 x10 3/mm (ref 3.6–11.0)

## 2013-12-05 LAB — COMPREHENSIVE METABOLIC PANEL
ALK PHOS: 108 U/L
ALT: 18 U/L
AST: 18 U/L (ref 15–37)
Albumin: 2.8 g/dL — ABNORMAL LOW (ref 3.4–5.0)
Anion Gap: 11 (ref 7–16)
BUN: 16 mg/dL (ref 7–18)
Bilirubin,Total: 0.5 mg/dL (ref 0.2–1.0)
CHLORIDE: 106 mmol/L (ref 98–107)
CO2: 26 mmol/L (ref 21–32)
Calcium, Total: 8.8 mg/dL (ref 8.5–10.1)
Creatinine: 1.52 mg/dL — ABNORMAL HIGH (ref 0.60–1.30)
EGFR (African American): 45 — ABNORMAL LOW
GFR CALC NON AF AMER: 37 — AB
GLUCOSE: 100 mg/dL — AB (ref 65–99)
OSMOLALITY: 286 (ref 275–301)
Potassium: 3.7 mmol/L (ref 3.5–5.1)
SODIUM: 143 mmol/L (ref 136–145)
Total Protein: 6.6 g/dL (ref 6.4–8.2)

## 2013-12-05 LAB — CBC CANCER CENTER
BASOS PCT: 0.3 %
Basophil #: 0 x10 3/mm (ref 0.0–0.1)
EOS ABS: 0.1 x10 3/mm (ref 0.0–0.7)
Eosinophil %: 1 %
HCT: 35.1 % (ref 35.0–47.0)
HGB: 11.2 g/dL — ABNORMAL LOW (ref 12.0–16.0)
Lymphocyte #: 0.9 x10 3/mm — ABNORMAL LOW (ref 1.0–3.6)
Lymphocyte %: 13.5 %
MCH: 30.7 pg (ref 26.0–34.0)
MCHC: 31.7 g/dL — ABNORMAL LOW (ref 32.0–36.0)
MCV: 97 fL (ref 80–100)
MONO ABS: 0.6 x10 3/mm (ref 0.2–0.9)
Monocyte %: 8.6 %
NEUTROS ABS: 5 x10 3/mm (ref 1.4–6.5)
NEUTROS PCT: 76.6 %
Platelet: 287 x10 3/mm (ref 150–440)
RBC: 3.63 10*6/uL — ABNORMAL LOW (ref 3.80–5.20)
RDW: 17.1 % — ABNORMAL HIGH (ref 11.5–14.5)
WBC: 6.5 x10 3/mm (ref 3.6–11.0)

## 2013-12-05 LAB — MAGNESIUM: MAGNESIUM: 1.9 mg/dL

## 2013-12-06 LAB — CEA: CEA: 6.9 ng/mL — AB (ref 0.0–4.7)

## 2013-12-16 LAB — COMPREHENSIVE METABOLIC PANEL
ALBUMIN: 2.8 g/dL — AB (ref 3.4–5.0)
ALK PHOS: 120 U/L — AB
ALT: 14 U/L
Anion Gap: 12 (ref 7–16)
BUN: 21 mg/dL — ABNORMAL HIGH (ref 7–18)
Bilirubin,Total: 0.4 mg/dL (ref 0.2–1.0)
CALCIUM: 9.5 mg/dL (ref 8.5–10.1)
Chloride: 102 mmol/L (ref 98–107)
Co2: 25 mmol/L (ref 21–32)
Creatinine: 1.76 mg/dL — ABNORMAL HIGH (ref 0.60–1.30)
EGFR (Non-African Amer.): 31 — ABNORMAL LOW
GFR CALC AF AMER: 38 — AB
Glucose: 172 mg/dL — ABNORMAL HIGH (ref 65–99)
OSMOLALITY: 285 (ref 275–301)
Potassium: 4.3 mmol/L (ref 3.5–5.1)
SGOT(AST): 18 U/L (ref 15–37)
Sodium: 139 mmol/L (ref 136–145)
Total Protein: 7.2 g/dL (ref 6.4–8.2)

## 2013-12-16 LAB — CBC CANCER CENTER
BASOS ABS: 0 x10 3/mm (ref 0.0–0.1)
BASOS PCT: 0.5 %
EOS PCT: 0.1 %
Eosinophil #: 0 x10 3/mm (ref 0.0–0.7)
HCT: 36.5 % (ref 35.0–47.0)
HGB: 11.8 g/dL — ABNORMAL LOW (ref 12.0–16.0)
LYMPHS PCT: 11.6 %
Lymphocyte #: 0.9 x10 3/mm — ABNORMAL LOW (ref 1.0–3.6)
MCH: 30.3 pg (ref 26.0–34.0)
MCHC: 32.4 g/dL (ref 32.0–36.0)
MCV: 94 fL (ref 80–100)
MONO ABS: 0.2 x10 3/mm (ref 0.2–0.9)
MONOS PCT: 3 %
NEUTROS PCT: 84.8 %
Neutrophil #: 6.7 x10 3/mm — ABNORMAL HIGH (ref 1.4–6.5)
PLATELETS: 362 x10 3/mm (ref 150–440)
RBC: 3.9 10*6/uL (ref 3.80–5.20)
RDW: 16.8 % — ABNORMAL HIGH (ref 11.5–14.5)
WBC: 7.9 x10 3/mm (ref 3.6–11.0)

## 2013-12-16 LAB — MAGNESIUM: MAGNESIUM: 2 mg/dL

## 2013-12-17 ENCOUNTER — Ambulatory Visit: Payer: Self-pay | Admitting: Oncology

## 2013-12-19 LAB — CBC CANCER CENTER
BASOS ABS: 0 x10 3/mm (ref 0.0–0.1)
Basophil %: 0.5 %
EOS PCT: 0.5 %
Eosinophil #: 0 x10 3/mm (ref 0.0–0.7)
HCT: 33.4 % — ABNORMAL LOW (ref 35.0–47.0)
HGB: 11 g/dL — ABNORMAL LOW (ref 12.0–16.0)
Lymphocyte #: 1.2 x10 3/mm (ref 1.0–3.6)
Lymphocyte %: 13.1 %
MCH: 30.3 pg (ref 26.0–34.0)
MCHC: 32.9 g/dL (ref 32.0–36.0)
MCV: 92 fL (ref 80–100)
Monocyte #: 0.9 x10 3/mm (ref 0.2–0.9)
Monocyte %: 9.8 %
NEUTROS PCT: 76.1 %
Neutrophil #: 6.9 x10 3/mm — ABNORMAL HIGH (ref 1.4–6.5)
Platelet: 350 x10 3/mm (ref 150–440)
RBC: 3.63 10*6/uL — ABNORMAL LOW (ref 3.80–5.20)
RDW: 16.6 % — ABNORMAL HIGH (ref 11.5–14.5)
WBC: 9.1 x10 3/mm (ref 3.6–11.0)

## 2013-12-19 LAB — COMPREHENSIVE METABOLIC PANEL
Albumin: 2.8 g/dL — ABNORMAL LOW (ref 3.4–5.0)
Alkaline Phosphatase: 107 U/L
Anion Gap: 11 (ref 7–16)
BUN: 16 mg/dL (ref 7–18)
Bilirubin,Total: 0.4 mg/dL (ref 0.2–1.0)
CO2: 25 mmol/L (ref 21–32)
Calcium, Total: 8.6 mg/dL (ref 8.5–10.1)
Chloride: 106 mmol/L (ref 98–107)
Creatinine: 1.47 mg/dL — ABNORMAL HIGH (ref 0.60–1.30)
EGFR (African American): 46 — ABNORMAL LOW
EGFR (Non-African Amer.): 38 — ABNORMAL LOW
Glucose: 92 mg/dL (ref 65–99)
OSMOLALITY: 284 (ref 275–301)
POTASSIUM: 3.5 mmol/L (ref 3.5–5.1)
SGOT(AST): 22 U/L (ref 15–37)
SGPT (ALT): 15 U/L
SODIUM: 142 mmol/L (ref 136–145)
Total Protein: 6.8 g/dL (ref 6.4–8.2)

## 2013-12-19 LAB — MAGNESIUM: Magnesium: 1.8 mg/dL

## 2014-01-02 LAB — COMPREHENSIVE METABOLIC PANEL
ALK PHOS: 110 U/L
ALT: 10 U/L — AB
AST: 16 U/L (ref 15–37)
Albumin: 2.6 g/dL — ABNORMAL LOW (ref 3.4–5.0)
Anion Gap: 9 (ref 7–16)
BILIRUBIN TOTAL: 0.3 mg/dL (ref 0.2–1.0)
BUN: 13 mg/dL (ref 7–18)
Calcium, Total: 8.2 mg/dL — ABNORMAL LOW (ref 8.5–10.1)
Chloride: 109 mmol/L — ABNORMAL HIGH (ref 98–107)
Co2: 25 mmol/L (ref 21–32)
Creatinine: 1.23 mg/dL (ref 0.60–1.30)
EGFR (African American): 57 — ABNORMAL LOW
GFR CALC NON AF AMER: 47 — AB
GLUCOSE: 78 mg/dL (ref 65–99)
Osmolality: 284 (ref 275–301)
POTASSIUM: 4.1 mmol/L (ref 3.5–5.1)
Sodium: 143 mmol/L (ref 136–145)
Total Protein: 6.3 g/dL — ABNORMAL LOW (ref 6.4–8.2)

## 2014-01-02 LAB — CBC CANCER CENTER
BASOS PCT: 0.9 %
Basophil #: 0.1 x10 3/mm (ref 0.0–0.1)
Eosinophil #: 0 x10 3/mm (ref 0.0–0.7)
Eosinophil %: 0.3 %
HCT: 31.8 % — AB (ref 35.0–47.0)
HGB: 10.2 g/dL — ABNORMAL LOW (ref 12.0–16.0)
LYMPHS PCT: 21.9 %
Lymphocyte #: 2 x10 3/mm (ref 1.0–3.6)
MCH: 29.5 pg (ref 26.0–34.0)
MCHC: 32.2 g/dL (ref 32.0–36.0)
MCV: 92 fL (ref 80–100)
MONOS PCT: 9.9 %
Monocyte #: 0.9 x10 3/mm (ref 0.2–0.9)
Neutrophil #: 6.1 x10 3/mm (ref 1.4–6.5)
Neutrophil %: 67 %
Platelet: 352 x10 3/mm (ref 150–440)
RBC: 3.47 10*6/uL — ABNORMAL LOW (ref 3.80–5.20)
RDW: 16.5 % — ABNORMAL HIGH (ref 11.5–14.5)
WBC: 9 x10 3/mm (ref 3.6–11.0)

## 2014-01-17 ENCOUNTER — Ambulatory Visit: Payer: Self-pay | Admitting: Oncology

## 2014-01-23 LAB — CBC CANCER CENTER
Basophil #: 0 x10 3/mm (ref 0.0–0.1)
Basophil %: 0.4 %
EOS PCT: 0.1 %
Eosinophil #: 0 x10 3/mm (ref 0.0–0.7)
HCT: 31.5 % — ABNORMAL LOW (ref 35.0–47.0)
HGB: 10.1 g/dL — AB (ref 12.0–16.0)
Lymphocyte #: 2.2 x10 3/mm (ref 1.0–3.6)
Lymphocyte %: 21.1 %
MCH: 28.6 pg (ref 26.0–34.0)
MCHC: 32.1 g/dL (ref 32.0–36.0)
MCV: 89 fL (ref 80–100)
MONOS PCT: 9.5 %
Monocyte #: 1 x10 3/mm — ABNORMAL HIGH (ref 0.2–0.9)
NEUTROS PCT: 68.9 %
Neutrophil #: 7.1 x10 3/mm — ABNORMAL HIGH (ref 1.4–6.5)
Platelet: 373 x10 3/mm (ref 150–440)
RBC: 3.54 10*6/uL — AB (ref 3.80–5.20)
RDW: 16.4 % — ABNORMAL HIGH (ref 11.5–14.5)
WBC: 10.3 x10 3/mm (ref 3.6–11.0)

## 2014-01-23 LAB — COMPREHENSIVE METABOLIC PANEL
ALK PHOS: 153 U/L — AB
ANION GAP: 12 (ref 7–16)
Albumin: 2.4 g/dL — ABNORMAL LOW (ref 3.4–5.0)
BILIRUBIN TOTAL: 0.2 mg/dL (ref 0.2–1.0)
BUN: 17 mg/dL (ref 7–18)
CO2: 22 mmol/L (ref 21–32)
CREATININE: 1.36 mg/dL — AB (ref 0.60–1.30)
Calcium, Total: 8.8 mg/dL (ref 8.5–10.1)
Chloride: 109 mmol/L — ABNORMAL HIGH (ref 98–107)
EGFR (African American): 51 — ABNORMAL LOW
EGFR (Non-African Amer.): 42 — ABNORMAL LOW
Glucose: 92 mg/dL (ref 65–99)
OSMOLALITY: 286 (ref 275–301)
POTASSIUM: 4.1 mmol/L (ref 3.5–5.1)
SGOT(AST): 18 U/L (ref 15–37)
SGPT (ALT): 12 U/L — ABNORMAL LOW
SODIUM: 143 mmol/L (ref 136–145)
Total Protein: 6.5 g/dL (ref 6.4–8.2)

## 2014-01-23 LAB — T4, FREE: FREE THYROXINE: 1.09 ng/dL (ref 0.76–1.46)

## 2014-01-23 LAB — TSH: Thyroid Stimulating Horm: 0.65 u[IU]/mL

## 2014-01-23 LAB — MAGNESIUM: Magnesium: 2 mg/dL

## 2014-02-03 LAB — CBC CANCER CENTER
BASOS ABS: 0 x10 3/mm (ref 0.0–0.1)
Basophil %: 0.5 %
EOS ABS: 0.1 x10 3/mm (ref 0.0–0.7)
Eosinophil %: 0.5 %
HCT: 32.4 % — AB (ref 35.0–47.0)
HGB: 10.2 g/dL — ABNORMAL LOW (ref 12.0–16.0)
LYMPHS PCT: 19.8 %
Lymphocyte #: 2.1 x10 3/mm (ref 1.0–3.6)
MCH: 28.8 pg (ref 26.0–34.0)
MCHC: 31.6 g/dL — AB (ref 32.0–36.0)
MCV: 91 fL (ref 80–100)
MONOS PCT: 8.8 %
Monocyte #: 0.9 x10 3/mm (ref 0.2–0.9)
NEUTROS ABS: 7.5 x10 3/mm — AB (ref 1.4–6.5)
Neutrophil %: 70.4 %
PLATELETS: 404 x10 3/mm (ref 150–440)
RBC: 3.56 10*6/uL — ABNORMAL LOW (ref 3.80–5.20)
RDW: 17.5 % — ABNORMAL HIGH (ref 11.5–14.5)
WBC: 10.7 x10 3/mm (ref 3.6–11.0)

## 2014-02-03 LAB — COMPREHENSIVE METABOLIC PANEL
ALT: 18 U/L
ANION GAP: 7 (ref 7–16)
Albumin: 2.4 g/dL — ABNORMAL LOW (ref 3.4–5.0)
Alkaline Phosphatase: 173 U/L — ABNORMAL HIGH
BUN: 20 mg/dL — AB (ref 7–18)
Bilirubin,Total: 0.3 mg/dL (ref 0.2–1.0)
CREATININE: 1.38 mg/dL — AB (ref 0.60–1.30)
Calcium, Total: 8.7 mg/dL (ref 8.5–10.1)
Chloride: 115 mmol/L — ABNORMAL HIGH (ref 98–107)
Co2: 21 mmol/L (ref 21–32)
EGFR (African American): 50 — ABNORMAL LOW
EGFR (Non-African Amer.): 41 — ABNORMAL LOW
GLUCOSE: 97 mg/dL (ref 65–99)
Osmolality: 288 (ref 275–301)
POTASSIUM: 5 mmol/L (ref 3.5–5.1)
SGOT(AST): 26 U/L (ref 15–37)
Sodium: 143 mmol/L (ref 136–145)
TOTAL PROTEIN: 6.6 g/dL (ref 6.4–8.2)

## 2014-02-03 LAB — MAGNESIUM: Magnesium: 2.2 mg/dL

## 2014-02-06 LAB — URINALYSIS, COMPLETE
BILIRUBIN, UR: NEGATIVE
Bacteria: NONE SEEN
GLUCOSE, UR: NEGATIVE mg/dL (ref 0–75)
Ketone: NEGATIVE
Nitrite: NEGATIVE
Ph: 5 (ref 4.5–8.0)
Protein: 30
RBC,UR: 67 /HPF (ref 0–5)
Specific Gravity: 1.021 (ref 1.003–1.030)
Squamous Epithelial: 9

## 2014-02-08 LAB — URINE CULTURE

## 2014-02-14 ENCOUNTER — Inpatient Hospital Stay: Payer: Self-pay | Admitting: Oncology

## 2014-02-14 LAB — COMPREHENSIVE METABOLIC PANEL
ALBUMIN: 2.8 g/dL — AB (ref 3.4–5.0)
ALK PHOS: 252 U/L — AB (ref 46–116)
ANION GAP: 11 (ref 7–16)
AST: 32 U/L (ref 15–37)
BUN: 32 mg/dL — ABNORMAL HIGH (ref 7–18)
Bilirubin,Total: 0.4 mg/dL (ref 0.2–1.0)
CALCIUM: 9.3 mg/dL (ref 8.5–10.1)
CO2: 19 mmol/L — AB (ref 21–32)
Chloride: 111 mmol/L — ABNORMAL HIGH (ref 98–107)
Creatinine: 1.71 mg/dL — ABNORMAL HIGH (ref 0.60–1.30)
EGFR (African American): 39 — ABNORMAL LOW
GFR CALC NON AF AMER: 32 — AB
Glucose: 129 mg/dL — ABNORMAL HIGH (ref 65–99)
Osmolality: 290 (ref 275–301)
Potassium: 4.8 mmol/L (ref 3.5–5.1)
SGPT (ALT): 22 U/L (ref 14–63)
Sodium: 141 mmol/L (ref 136–145)
Total Protein: 7.6 g/dL (ref 6.4–8.2)

## 2014-02-14 LAB — CBC CANCER CENTER
BASOS PCT: 0 %
Basophil #: 0 x10 3/mm (ref 0.0–0.1)
Eosinophil #: 0 x10 3/mm (ref 0.0–0.7)
Eosinophil %: 0 %
HCT: 37.6 % (ref 35.0–47.0)
HGB: 11.6 g/dL — AB (ref 12.0–16.0)
LYMPHS PCT: 6.1 %
Lymphocyte #: 0.9 x10 3/mm — ABNORMAL LOW (ref 1.0–3.6)
MCH: 27.4 pg (ref 26.0–34.0)
MCHC: 30.8 g/dL — ABNORMAL LOW (ref 32.0–36.0)
MCV: 89 fL (ref 80–100)
MONOS PCT: 4.7 %
Monocyte #: 0.7 x10 3/mm (ref 0.2–0.9)
NEUTROS ABS: 13 x10 3/mm — AB (ref 1.4–6.5)
Neutrophil %: 89.2 %
Platelet: 404 x10 3/mm (ref 150–440)
RBC: 4.22 10*6/uL (ref 3.80–5.20)
RDW: 17.6 % — ABNORMAL HIGH (ref 11.5–14.5)
WBC: 14.6 x10 3/mm — ABNORMAL HIGH (ref 3.6–11.0)

## 2014-02-14 LAB — MAGNESIUM: Magnesium: 2.3 mg/dL

## 2014-02-15 LAB — CBC WITH DIFFERENTIAL/PLATELET
Basophil #: 0 10*3/uL (ref 0.0–0.1)
Basophil %: 0.1 %
Eosinophil #: 0 10*3/uL (ref 0.0–0.7)
Eosinophil %: 0 %
HCT: 29.4 % — ABNORMAL LOW (ref 35.0–47.0)
HGB: 9.4 g/dL — ABNORMAL LOW (ref 12.0–16.0)
Lymphocyte #: 1.4 10*3/uL (ref 1.0–3.6)
Lymphocyte %: 9.6 %
MCH: 28.2 pg (ref 26.0–34.0)
MCHC: 31.9 g/dL — ABNORMAL LOW (ref 32.0–36.0)
MCV: 88 fL (ref 80–100)
Monocyte #: 0.9 x10 3/mm (ref 0.2–0.9)
Monocyte %: 6.5 %
Neutrophil #: 12 10*3/uL — ABNORMAL HIGH (ref 1.4–6.5)
Neutrophil %: 83.8 %
Platelet: 309 10*3/uL (ref 150–440)
RBC: 3.32 10*6/uL — ABNORMAL LOW (ref 3.80–5.20)
RDW: 17.7 % — ABNORMAL HIGH (ref 11.5–14.5)
WBC: 14.4 10*3/uL — ABNORMAL HIGH (ref 3.6–11.0)

## 2014-02-15 LAB — BASIC METABOLIC PANEL
Anion Gap: 7 (ref 7–16)
BUN: 31 mg/dL — ABNORMAL HIGH (ref 7–18)
Calcium, Total: 8.2 mg/dL — ABNORMAL LOW (ref 8.5–10.1)
Chloride: 114 mmol/L — ABNORMAL HIGH (ref 98–107)
Co2: 21 mmol/L (ref 21–32)
Creatinine: 1.45 mg/dL — ABNORMAL HIGH (ref 0.60–1.30)
EGFR (African American): 47 — ABNORMAL LOW
EGFR (Non-African Amer.): 39 — ABNORMAL LOW
Glucose: 112 mg/dL — ABNORMAL HIGH (ref 65–99)
Osmolality: 290 (ref 275–301)
Potassium: 4.6 mmol/L (ref 3.5–5.1)
Sodium: 142 mmol/L (ref 136–145)

## 2014-02-16 LAB — BASIC METABOLIC PANEL
Anion Gap: 7 (ref 7–16)
BUN: 28 mg/dL — ABNORMAL HIGH (ref 7–18)
Calcium, Total: 7.7 mg/dL — ABNORMAL LOW (ref 8.5–10.1)
Chloride: 117 mmol/L — ABNORMAL HIGH (ref 98–107)
Co2: 20 mmol/L — ABNORMAL LOW (ref 21–32)
Creatinine: 1.27 mg/dL (ref 0.60–1.30)
EGFR (African American): 55 — ABNORMAL LOW
EGFR (Non-African Amer.): 45 — ABNORMAL LOW
Glucose: 111 mg/dL — ABNORMAL HIGH (ref 65–99)
Osmolality: 293 (ref 275–301)
Potassium: 4.3 mmol/L (ref 3.5–5.1)
Sodium: 144 mmol/L (ref 136–145)

## 2014-02-16 LAB — AMMONIA: Ammonia, Plasma: 17 mcmol/L (ref 11–32)

## 2014-02-16 LAB — CBC WITH DIFFERENTIAL/PLATELET
Basophil #: 0 10*3/uL (ref 0.0–0.1)
Basophil %: 0.1 %
Eosinophil #: 0 10*3/uL (ref 0.0–0.7)
Eosinophil %: 0 %
HCT: 28.2 % — ABNORMAL LOW (ref 35.0–47.0)
HGB: 8.9 g/dL — ABNORMAL LOW (ref 12.0–16.0)
Lymphocyte #: 1.2 10*3/uL (ref 1.0–3.6)
Lymphocyte %: 9.1 %
MCH: 28 pg (ref 26.0–34.0)
MCHC: 31.7 g/dL — ABNORMAL LOW (ref 32.0–36.0)
MCV: 88 fL (ref 80–100)
Monocyte #: 0.9 x10 3/mm (ref 0.2–0.9)
Monocyte %: 7.2 %
Neutrophil #: 10.8 10*3/uL — ABNORMAL HIGH (ref 1.4–6.5)
Neutrophil %: 83.6 %
Platelet: 292 10*3/uL (ref 150–440)
RBC: 3.19 10*6/uL — ABNORMAL LOW (ref 3.80–5.20)
RDW: 17.5 % — ABNORMAL HIGH (ref 11.5–14.5)
WBC: 12.9 10*3/uL — ABNORMAL HIGH (ref 3.6–11.0)

## 2014-02-16 LAB — URINALYSIS, COMPLETE
Bilirubin,UR: NEGATIVE
Blood: NEGATIVE
Glucose,UR: NEGATIVE mg/dL (ref 0–75)
Ketone: NEGATIVE
Leukocyte Esterase: NEGATIVE
Nitrite: NEGATIVE
Ph: 5 (ref 4.5–8.0)
Protein: NEGATIVE
RBC,UR: 3 /HPF (ref 0–5)
Specific Gravity: 1.011 (ref 1.003–1.030)
Squamous Epithelial: 1
WBC UR: 2 /HPF (ref 0–5)

## 2014-02-16 LAB — TSH: Thyroid Stimulating Horm: 0.379 u[IU]/mL — ABNORMAL LOW

## 2014-02-16 LAB — FOLATE: Folic Acid: 16.1 ng/mL (ref 3.1–17.5)

## 2014-02-16 LAB — FERRITIN: Ferritin (ARMC): 453 ng/mL — ABNORMAL HIGH (ref 8–388)

## 2014-02-17 ENCOUNTER — Ambulatory Visit: Payer: Self-pay | Admitting: Oncology

## 2014-02-17 LAB — BASIC METABOLIC PANEL
Anion Gap: 9 (ref 7–16)
BUN: 28 mg/dL — ABNORMAL HIGH (ref 7–18)
Calcium, Total: 8.2 mg/dL — ABNORMAL LOW (ref 8.5–10.1)
Chloride: 114 mmol/L — ABNORMAL HIGH (ref 98–107)
Co2: 22 mmol/L (ref 21–32)
Creatinine: 1.23 mg/dL (ref 0.60–1.30)
EGFR (African American): 57 — ABNORMAL LOW
EGFR (Non-African Amer.): 47 — ABNORMAL LOW
Glucose: 97 mg/dL (ref 65–99)
Osmolality: 294 (ref 275–301)
Potassium: 4.6 mmol/L (ref 3.5–5.1)
Sodium: 145 mmol/L (ref 136–145)

## 2014-02-17 LAB — COMPREHENSIVE METABOLIC PANEL
ALT: 22 U/L (ref 14–63)
ANION GAP: 11 (ref 7–16)
Albumin: 2 g/dL — ABNORMAL LOW (ref 3.4–5.0)
Alkaline Phosphatase: 188 U/L — ABNORMAL HIGH (ref 46–116)
BILIRUBIN TOTAL: 0.2 mg/dL (ref 0.2–1.0)
BUN: 33 mg/dL — ABNORMAL HIGH (ref 7–18)
CALCIUM: 8.3 mg/dL — AB (ref 8.5–10.1)
Chloride: 110 mmol/L — ABNORMAL HIGH (ref 98–107)
Co2: 20 mmol/L — ABNORMAL LOW (ref 21–32)
Creatinine: 1.29 mg/dL (ref 0.60–1.30)
EGFR (Non-African Amer.): 45 — ABNORMAL LOW
GFR CALC AF AMER: 54 — AB
Glucose: 141 mg/dL — ABNORMAL HIGH (ref 65–99)
Osmolality: 291 (ref 275–301)
POTASSIUM: 4.4 mmol/L (ref 3.5–5.1)
SGOT(AST): 28 U/L (ref 15–37)
SODIUM: 141 mmol/L (ref 136–145)
TOTAL PROTEIN: 5.6 g/dL — AB (ref 6.4–8.2)

## 2014-02-17 LAB — T4, FREE: Free Thyroxine: 0.93 ng/dL (ref 0.76–1.46)

## 2014-02-18 LAB — CBC WITH DIFFERENTIAL/PLATELET
Basophil #: 0.1 10*3/uL (ref 0.0–0.1)
Basophil %: 0.7 %
EOS PCT: 0 %
Eosinophil #: 0 10*3/uL (ref 0.0–0.7)
HCT: 30.3 % — ABNORMAL LOW (ref 35.0–47.0)
HGB: 9.5 g/dL — AB (ref 12.0–16.0)
Lymphocyte #: 1 10*3/uL (ref 1.0–3.6)
Lymphocyte %: 7.4 %
MCH: 27.6 pg (ref 26.0–34.0)
MCHC: 31.3 g/dL — ABNORMAL LOW (ref 32.0–36.0)
MCV: 88 fL (ref 80–100)
MONOS PCT: 6.8 %
Monocyte #: 0.9 x10 3/mm (ref 0.2–0.9)
Neutrophil #: 11.1 10*3/uL — ABNORMAL HIGH (ref 1.4–6.5)
Neutrophil %: 85.1 %
PLATELETS: 280 10*3/uL (ref 150–440)
RBC: 3.44 10*6/uL — AB (ref 3.80–5.20)
RDW: 17.6 % — ABNORMAL HIGH (ref 11.5–14.5)
WBC: 13 10*3/uL — AB (ref 3.6–11.0)

## 2014-02-18 LAB — URINE CULTURE

## 2014-03-18 ENCOUNTER — Ambulatory Visit
Admit: 2014-03-18 | Disposition: A | Discharge status: Home / Self Care | Payer: Self-pay | Attending: Oncology | Admitting: Oncology

## 2014-03-18 DEATH — deceased

## 2014-05-10 NOTE — H&P (Signed)
PATIENT NAME:  Amber Ingram, [name] MR#:  [id number] DATE OF BIRTH:  [birth year]  DATE OF ADMISSION:  06/08/2013  PRIMARY CARE PHYSICIAN:  Dr. Einar Crow.   REFERRING EMERGENCY ROOM PHYSICIAN:  Dr. Lenard Lance.  CHIEF COMPLAINT:  Fever, headache and unsteady gait.   HISTORY OF PRESENT ILLNESS:  The patient is a 64 year old Caucasian female with a past medical history of hypertension, was diagnosed with metastatic brain cancer and also with lung mass which could be the primary, is presenting to the ER with a chief complaint of fever.  The patient is reporting that she spiked a temperature of 101 degrees Fahrenheit.  She underwent 15 rounds of radiation therapy regarding her metastatic brain cancer and her first chemotherapy was given last week.  She sees Dr. Doylene Canning as an outpatient.  Chest x-ray with no acute abnormalities.  UA is positive for urinary tract infection with positive leukocyte esterase and nitrites.  ER physician has discussed with the patient's oncologist Dr. Doylene Canning who has recommended to start the patient on cefepime and vancomycin.  According to the family members the patient was unsteady while walking today, which is a new finding.  CAT scan of the head was done in the ER which has revealed no acute changes.  The patient is complaining of headache which seemed to be mostly from stress.  Blood cultures and urine cultures were ordered and the patient has received IV cefepime and vancomycin as recommended by Dr. Doylene Canning.  During my examination, the patient is resting comfortably.  Denies any chest pain or shortness of breath.  Denies any hemoptysis.  Still continues to smoke three cigarettes per day and she is admitting 40 pack-years of smoking history.   PAST MEDICAL HISTORY:  Hypertension, lung cancer with brain mets, cerebral edema.  It looks like she has chronic headache.   PAST SURGICAL HISTORY:  Partial hysterectomy.   ALLERGIES:  PENICILLIN.   PSYCHOSOCIAL HISTORY:  Lives at  home with husband.  Smoked cigarettes for 40 years and now she has been smoking 3 cigarettes per day.  Occasional intake of alcohol.  Denies any illicit drug usage.   FAMILY HISTORY:  Father has a history of lung cancer and prostate cancer.   HOME MEDICATIONS:  Zofran 4 mg 1 tablet by mouth q. 6 hours, trazodone 100 mg by mouth once daily, simvastatin 40 mg by mouth once a day, potassium chloride 20 mEq by mouth once daily, omeprazole 20 mg once daily, hydrochlorothiazide, triamterene 25/37.5 mg 1 tablet by mouth once daily, dexamethasone 4 mg 1 tablet by mouth once a day, folic acid 1 mg by mouth once daily.  REVIEW OF SYSTEMS:   CONSTITUTIONAL:  Complaining of fever, fatigue.  Denies any blurry vision, but complaining of headache.   EYES:  Denies blurry vision.  Denies glaucoma, cataracts.  EARS, NOSE, THROAT:  Denies epistaxis, discharge, tinnitus, snoring.  RESPIRATORY:  Complaining of dry cough.  No COPD.  Has lung cancer.  CARDIOVASCULAR:  No chest pain, palpitations, syncope.  GASTROINTESTINAL:  Denies nausea, vomiting, diarrhea, abdominal pain.  No hematemesis, melena.  GENITOURINARY:  No dysuria, hematuria, renal calculus, frequency. GYNECOLOGIC AND BREAST:  Status post partial hysterectomy.  Denies any breast mass.  ENDOCRINE:  Denies polyuria, nocturia, thyroid problems.  HEMATOLOGIC AND LYMPHATIC:  No anemia, easy bruising or bleeding.  Has a chronic history of lung cancer with brain mets and cerebral edema, on Decadron.  MUSCULOSKELETAL:  No joint pain in the neck and back.  Denies any shoulder pain.  Denies gout.  NEUROLOGIC:  Denies vertigo, ataxia, dementia, dementia.  Complaining of wobbly gait and headaches.  PSYCHIATRIC:  No ADD, OCD.  Denies any insomnia.   PHYSICAL EXAMINATION:  VITAL SIGNS:  Temperature 99 degrees Fahrenheit, pulse 86, respirations 18, blood pressure is 133/61, pulse ox 95% on 2 liters.  GENERAL APPEARANCE:  Not under acute distress.  Moderately built and  nourished.  HEENT:  Normocephalic, atraumatic.  Pupils are equally reacting to light and accommodation.  No scleral icterus.  No conjunctival injection.  No sinus tenderness.  Moist mucous membranes.  NECK:  Supple.  No JVD.  No thyromegaly.  Range of motion is intact.  LUNGS:  Moderate air entry.  No wheezing.  No crackles.  Positive rales.  Diminished breath sounds at the bases.  CARDIAC:  S1, S2 normal.  Regular rate and rhythm.  No murmurs.  The patient has port in the anterior chest wall. ABDOMEN:  Soft.  Bowel sounds are positive in all four quadrants.  Nontender, nondistended.  No hepatosplenomegaly.  No masses felt.   NEUROLOGIC:  Awake, alert, oriented x 3.  Cranial nerves II through XII are grossly intact.  Motor and sensory are intact.  Gait is not evaluated as the patient is unsteady.  Reflexes are 2+.  EXTREMITIES:  No cyanosis.  No clubbing.  SKIN:  Warm to touch.  Normal turgor.  No rashes.  No lesions.  MUSCULOSKELETAL:  No joint effusion, tenderness, erythema.  PSYCHIATRIC:  Normal mood and affect.   LABORATORY AND IMAGING STUDIES:  Urinalysis, yellow color, clear in appearance, glucose, bilirubin and ketones are negative, specific gravity 1.012, nitrites are positive, leukocyte esterase 2+.  WBC is present in the urine.  CBC is normal.  LFTs:  Total protein 6.4, albumin 2.9.  The rest of the LFTs are normal.  Chem-8:  Glucose 101, BUN and creatinine are normal.  Sodium 134, potassium 2.9, chloride 99, CO2 26, GFR of 49.  Anion gap, serum osmolality and calcium are normal.  CAT scan of the head without contrast has revealed multiple brain metastasis.  No appreciable change since the prior study in April 2015.  There is a mass effect up on the fourth ventricle, but the ventricle is not occluded.  Portable chest x-ray has revealed no active disease.   ASSESSMENT AND PLAN:  A 64 year old pleasant Caucasian female presenting to the Emergency Room with a chief complaint of fever with past  medical history of metastatic lung cancer with brain metastasis and cerebral edema, currently on Decadron, is presenting with a history of fever.  1.  Sepsis from acute cystitis.  The patient is febrile and tachycardic.  Blood cultures and urine cultures were obtained.  The patient is started on empiric antibiotic cefepime and vancomycin as recommended by Dr. Doylene Canninghoksi.  As the patient currently diagnosed with acute cystitis we will consider discontinuing vancomycin and starting the patient on intravenous levofloxacin.   2.  Metastatic lung cancer with brain metastasis and cerebral edema.  The patient still continues to smoke.  Status post first chemotherapy last Wednesday, status post 15 rounds of radiation therapy and completed her radiation treatment.  SHE IS FULL CODE.  Consult is placed to oncology and palliative care.  We will continue Decadron 4 mg.  CT head has revealed no new changes.  3.  Hypokalemia.  We will provide potassium supplements through intravenous fluids and by mouth potassium.  4.  Hypertension.  We will resume her home medications and up-titrate medications as needed  basis.   5.  Nicotine dependence.  We will provide nicotine cessation counseling in a.m.  6.  We will provide gastrointestinal and deep vein thrombosis prophylaxis.  7.  CODE STATUS:  SHE IS A FULL CODE.  Husband and daughter are the medical power of attorney.   Diagnosis and plan of care was discussed in detail with the patient and her family members at bedside.  They all verbalized understanding of the plan.   Total time spent on admission is 50 minutes.  The patient will be transferred to Dr. Dareen Piano in a.m.    ____________________________ Ramonita Lab, MD ag:ea D: 06/08/2013 02:03:36 ET T: 06/08/2013 04:00:32 ET JOB#: [id number]  cc: Ramonita Lab, MD, <Dictator> Ramonita Lab MD ELECTRONICALLY SIGNED 06/20/2013 7:55

## 2014-05-10 NOTE — Consult Note (Signed)
Reason for Visit: This 64 year old Female patient presents to the clinic for initial evaluation of  brain metastases .   Referred by Dr. Doylene Canning.  Diagnosis:  Chief Complaint/Diagnosis   64 year old female with widespread brain metastasis from unknown primary most likely breast cancer  HPI   patient is a 64 year old female who complained of incoordination slight apraxia and falling towards her right side. Pulses been having a headache. Underwent MRI scan of the brain showing 5 infratentorial metastatic lesions and 5 supratentorial lesions the largest measuring 2 cm in the right cerebellum. There is also considerable regional edema and mass effect upon the fourth ventricle.chest x-ray was normal. Patient had about a 15 pound weight loss over the past several months. No cough hemoptysis or chest tightness. Does have a palpable node in her left axilla measuring approximately 2-3 cm. She's been seen by medical oncology and PET CT scan has been ordered. She is seen today for opinion regarding palliative radiation therapy to her whole brain.   Past Hx:    Hypertension:    Hysterectomy - Partial:   Past, Family and Social History:  Past Medical History positive   Past Surgical History basal cell carcinoma removed from her face   Family History positive   Family History Comments father with prostate cancer and lung cancer   Social History positive   Social History Comments 40-pack-year smoking history no EtOH abuse history   Additional Past Medical and Surgical History accompanied by husband and daughter to   Allergies:   Penicillin: Rash  Home Meds:  Home Medications: Medication Instructions Status  omeprazole 20 mg oral delayed release capsule 1 cap(s) orally once a day Active  dexamethasone 4 mg oral tablet 3 tabs po daily x 5 day  2 tabs po daily x 5 days  1 tab po daily x 5 days  Active  dexamethasone 4 mg oral tablet 1 tab(s) orally once a day x 15 days Active  potassium  chloride 20 mEq oral tablet, extended release 1 tab(s) orally once a day Active  simvastatin 40 mg oral tablet 1 tab(s) orally once a day (at bedtime) Active  hydrochlorothiazide-triamterene 25 mg-37.5 mg oral tablet 1 tab(s) orally once a day Active   Review of Systems:  General negative   Performance Status (ECOG) 0   Skin negative   Breast negative   Ophthalmologic negative   ENMT negative   Respiratory and Thorax negative   Cardiovascular negative   Gastrointestinal negative   Genitourinary negative   Musculoskeletal negative   Neurological see HPI   Psychiatric negative   Hematology/Lymphatics negative   Endocrine negative   Allergic/Immunologic negative   Review of Systems   review of systems obtained from nurses notes.  Physical Exam:  General/Skin/HEENT:  General normal   Skin normal   Eyes normal   ENMT normal   Head and Neck normal   Additional PE wwell-developed female in NAD. Lungs are clear to A&P cardiac examination shows regular rate and rhythm. He does have approximately 2-3 cm node in the left axilla otherwise left breast exam was within normal limits. Cranial nerves II through XII are grossly intact. Motor sensory and DTR levels are equal and symmetric in upper and lower extremities. Proprioception is intact. Visual fields are within normal range.   Breasts/Resp/CV/GI/GU:  Respiratory and Thorax normal   Cardiovascular normal   Gastrointestinal normal   Genitourinary normal   MS/Neuro/Psych/Lymph:  Musculoskeletal normal   Neurological normal   Lymphatics normal  Other Results:  Radiology Results: LabUnknown:    08-Apr-15 16:04, MRI Brain* and IAC WWO  PACS Image   MRI:  MRI Brain* and IAC WWO   REASON FOR EXAM:    dizziness  COMMENTS:       PROCEDURE: MR  - MR BRAIN W/WO INC IAC W/WO ROUTI  - Apr 24 2013  4:04PM     CLINICAL DATA:  Balance disturbance.  Headache.  Dizziness.    EXAM:  MRI HEAD WITHOUT AND  WITH CONTRAST    TECHNIQUE:  Multiplanar, multiecho pulse sequences of the brain and surrounding  structures were obtained without and with intravenous contrast.    CONTRAST:  15 cc MultiHance  COMPARISON:  None.    FINDINGS:  There are multiple intracranial masses consistent with metastatic  disease. The largest mass is within the right cerebellum just  posterior and lateral to the fourth ventricle which measures 20 x 14  mm and is associated with considerable vasogenic edema. There are 4  other cerebellar metastases, the largest on the right measuring 8  mm. No significant mass effect related to those lesions.    There are multiple metastases affecting the cerebral hemispheres.  There is a 6 mm metastasis at the inferior right temporal lobe.  There is an 8 mm metastasis in the left temporal lobe. There is an  11 mm metastasis in the right frontal lobe. There is a 5 mm  metastasis at the right frontoparietal junction. There is a 9 x 17  mm extra-axial mass in the left frontal region probably representing  a dural metastasis. Meningioma is theoretically possible.    Though there is mass effect upon the fourth ventricle, there does  not appear to be ventricular obstruction at this moment. The  underlying brain does not show evidence of any ischemic disease. No  extra-axial fluid collection. No pituitary mass. No inflammatory  sinus disease. No evidence of skull or skullbase metastatic disease.     IMPRESSION:  5 infratentorial metastatic lesions and 5 supratentorial metastatic  lesions as outlined above. The largest masses a 20 x 14 mm mass in  the right cerebellum just posterior and lateral to the fourth  ventricle with considerable regional edema and mass effect upon the  fourth ventricle but no ventricular obstruction at this moment.  Electronically Signed    By: Paulina Fusi M.D.    On: 04/24/2013 16:22         Verified By: Thomasenia Sales, M.D.,   Relevent Results:    Relevant Scans and Labs PET CT scan and chest x-rays are reviewed   Assessment and Plan: Impression:   brain metastasis from as yet unknown primary most likely breast based on left axillary node. Plan:   I discussed the case personally with medical oncology. PET CT scan will be ordered to try to hone in on an area for biopsy. I am proceeding at this time with treatment planning for whole brain radiation therapy will treat to 3750 cGy in 15 fractions. Risks and benefits of treatment including hair loss, fatigue, alteration blood counts, and possible decline in cognitive function or explained in detail to the patient. Hopefully we'll have a tissue diagnosis by the time I can perceive with radiation therapy. Patient will be also sent home on steroid therapy underDr. Choksidirection.  I would like to take this opportunity for allowing me to participate in the care of your patient..  CC Referral:  cc: Dr. Einar Crow   Electronic  Signatures: Rebeca Alerthrystal, Ethne Jeon S (MD)  (Signed 09-Apr-15 11:34)  Authored: HPI, Diagnosis, Past Hx, PFSH, Allergies, Home Meds, ROS, Physical Exam, Other Results, Relevent Results, Encounter Assessment and Plan, CC Referring Physician   Last Updated: 09-Apr-15 11:34 by Rebeca Alerthrystal, Tariana Moldovan S (MD)

## 2014-05-10 NOTE — Consult Note (Signed)
Brief Consult Note: Diagnosis: UTI, LUNG CANCER BRAIN METASTASIS.   Patient was seen by consultant.   Consult note dictated.   Orders entered.   Comments: SEE DICTATED NOTE.  BRIEFLY,  I HAVE ORDERED CHECK OF K AND MG TODAY. I HAVE INCREASED DEXAMETHASONE TO 4MG  PO BID. I THINK WE CAN CANCEL PALLIATIVE CARE CONSULT.  I WOULD NOT GET AN MRI OF BRAIN AT THIS TIME.  WAITING FOR BLOOD AND URINE CULTURES THEN CAN CHANGE TO PO MEDS.  Electronic Signatures: Marin RobertsGittin, Derya Dettmann G (MD)  (Signed 23-May-15 13:24)  Authored: Brief Consult Note   Last Updated: 23-May-15 13:24 by Marin RobertsGittin, Naseer Hearn G (MD)

## 2014-05-10 NOTE — Consult Note (Signed)
PATIENT NAME:  Amber Ingram, Amber Ingram MR#:  [id number] DATE OF BIRTH:  [birth year]  DATE OF CONSULTATION:  06/08/2013  CONSULTING PHYSICIAN:  Knute Neu. Lorre Nick, MD  Amber Ingram is a 64 year old patient who was admitted early this morning, presented to the Emergency Room last night, who is followed in the Cancer Center by Dr. Doylene Canning with metastatic cancer of the brain from lung primary, was admitted with a temperature of 101 and increased weakness, unsteadiness, exacerbation of unsteady gait and increase in headache that had previously been waxing and waning. Recently, dexamethasone had been tapered after her completing whole-brain radiation for metastatic disease. She is status post the first dose of Alimta and carboplatin 8 days ago. On admission, the urinalysis was positive for urinary tract infection. Antibiotics were initially planned empirically in a patient with a port to include vancomycin and cefepime in immunocompromised patient, but vancomycin currently not needed with apparent urine source. Cefepime was given and then changed to Levaquin. Head CT was done in the Emergency Room that showed no new findings, at least one area of edema surrounding a metastasis, no midline shift, some mass effect on the fourth ventricle which is described as stable.   PAST MEDICAL HISTORY:  Additionally includes hypertension.  PAST SURGICAL HISTORY: Hysterectomy in the past.   ALLERGIES: PENICILLIN.  SOCIAL HISTORY: The patient smoked for 40 years, now has an occasional cigarette. Reported occasional social alcohol. Had been at work up until her feeling ill.   FAMILY HISTORY: Noncontributory to current problem. It has been reviewed in the Cancer Center previously. It looks like there is lung and prostate cancer in the family.  MEDICATIONS AT HOME: Have been  Zofran 4 mg q.6 hours p.r.n., trazodone 100 mg daily, simvastatin 40 daily, potassium 20 mEq daily, omeprazole 20 daily, hydrochlorothiazide 25, triamterene  37.5, dexamethasone 4 mg (had recently been tapered to every other day), and also folic acid 1 mg a day.   SYSTEM REVIEW: When I evaluated the patient today, there was no longer any headache or chills or muscle aches or constitutional symptoms. She has some fatigue. She reports a poor appetite since the chemotherapy but no current nausea. She had constipation earlier in the week but none now, but she is passing gas easily. No vomiting or diarrhea. No visual disturbances. No ear or jaw pain. Had some irritation and wax crusting in the ear recently, not painful, no discharge. She had had, but not now, coughing and complained of a dry cough to the Emergency Room physician. Denies coughing at this time. No chest pain or palpitations. No dysuria or hematuria. The patient reported some recent small flecks or tiny drops of blood on or after bowel movements. No easy bleeding or bruising. No back or bone pain. Currently no focal weakness. We did not test the gait now. She states that her gait is unsteady, but does not have any vertigo or focal extremity weakness.   PHYSICAL EXAMINATION: GENERAL: Alert and cooperative. Slight pallor. No jaundice.  MOUTH: No thrush.  LYMPH: No lymph nodes palpable in the neck or supraclavicular.  LUNGS: Clear.  HEART: Regular. There is a port.  ABDOMEN: Nontender. No palpable mass or organomegaly.  NEUROLOGIC: Grossly nonfocal. I did not test the gait.  EXTREMITIES: No edema or deformed or hot or inflamed joint.   LABORATORY AND RADIOLOGICAL DATA: On admission, leukocyte esterase was 2+,  abnormal white cells in the urine. LFTs: Albumin 2.9, total protein 6.4, otherwise normal. Creatinine was normal. Potassium was 2.9; replacement  was given. Sodium was 134.  CT of the head, noncontrast, showed multiple brain metastases, an area of edema, and an area of mass effect on the fourth ventricle that had been previously been noted. Chest x-ray was unremarkable.   IMPRESSION AND PLAN:  Had fever, weakness, exacerbation of neurologic symptoms, likely from acute infection. Is currently afebrile. Presented febrile and tachycardic. Blood and urine cultures were obtained. Cefepime was started and then started on Levaquin. Head CT as stated was done. Potassium was supplemented p.o. and by IV.   Agree with antibiotic treatment and current plans. I would recheck potassium and magnesium again today. It is already ordered again for tomorrow. I do not think we need an MRI of the brain at this time. I do not think we need palliative care consultation at this time. The patient is likely to be home before the weekend is over. Watch for bleeding or bruising. Mobilize, get out of bed. The patient says she has the strength to be in a chair. I have increased Decadron, as the patient relates that since tapering of dexamethasone, she has been more unsteady. Watch for any rash, allergy. THERE IS A HISTORY OF ALLERGY TO PENICILLIN. I do not know the type of reaction, clearly no acute reaction to cephalosporin now. Watch neurologic symptoms. Review the cultures. Change to p.o. antibiotics if we have a definite culture finding. If blood cultures are negative at 36 hours, she can go home on p.o. antibiotics.    ____________________________ Knute Neuobert G. Lorre NickGittin, MD rgg:jcm D: 06/08/2013 13:33:02 ET T: 06/08/2013 17:00:47 ET JOB#: [id number]  cc: Knute Neuobert G. Lorre NickGittin, MD, <Dictator> Marin RobertsOBERT G GITTIN MD ELECTRONICALLY SIGNED 06/12/2013 11:52

## 2014-05-10 NOTE — Op Note (Signed)
PATIENT NAME:  Amber Ingram, [name] MR#:  [id number] DATE OF BIRTH:  10-01-1950  DATE OF PROCEDURE:  05/06/2013  SURGEON: Dr. Thelma Bargeaks.   ASSISTANT: Alpha GulaJosh Storm, PA student.  PREOPERATIVE DIAGNOSIS: Metastatic carcinoma.   POSTOPERATIVE DIAGNOSIS: Metastatic carcinoma.   OPERATION PERFORMED:  1. Left axillary lymph node biopsy.  2. Ultrasound-guided right internal jugular vein Port-A-Cath.   INDICATIONS FOR PROCEDURE: [name] is a 64 year old woman who presented with ataxia and then a finding of multiple brain metastases. She also has a palpable left axillary node and requires intravenous chemotherapy. For these reasons she was offered the above-named procedure.   DESCRIPTION OF PROCEDURE: The patient was brought to the operating suite and placed in the supine position. A laryngeal mask airway was established. The patient's left axilla was palpated, and the mass was easily palpable. The patient was prepped and draped in the usual sterile fashion. A timeout was called and the proper patient, position was identified. A skin incision was made overlying the mass in a curvilinear fashion just lateral to the pectoralis major muscle. The incision was deepened down through the subcutaneous tissues and the mass was easily palpable. Multiple fragments of this mass were sent for frozen section. The final was a carcinoma. Additional material was taken to ensure adequate sampling and then the wounds were irrigated and closed in multiple layers using Vicryl sutures. The skin was closed with a running subcuticular stitch and Dermabond was applied. Sterile dressings were applied.   We then turned our attention to the Port-A-Cath placement. The patient was reprepped and draped. The right internal jugular vein was identified using ultrasound. The right internal jugular vein was percutaneously catheterized and a wire was placed into the right side of the heart under fluoroscopic guidance. A port site was selected on the  anterior chest wall and carried down through the subcutaneous tissues. The port pocket was created, and then the catheter was inserted from the port site up through the neck incision. The catheter was then inserted through a peel-away sheath. The catheter was appropriately assembled and was thought to be in a bit long. It was, therefore, repositioned. At this point, the catheter appeared to be in good position and the catheter irrigated and flushed nicely. The catheter was secured to the subcutaneous tissues with interrupted 2-0 Prolene sutures in a 4-quadrant fashion. The wounds were then closed with Vicryl in the deeper layers and nylon on the skin. The patient was then transported to the recovery room in stable condition.    ____________________________ Sheppard Plumberimothy E. Thelma Bargeaks, MD teo:lt D: 05/06/2013 18:25:26 ET T: 05/07/2013 03:21:19 ET JOB#: [id number]  cc: Marcial Pacasimothy E. Thelma Bargeaks, MD, <Dictator> Janak K. Doylene Canninghoksi, MD Jasmine DecemberIMOTHY E Rudra Hobbins MD ELECTRONICALLY SIGNED 05/17/2013 15:57

## 2014-05-10 NOTE — Discharge Summary (Signed)
PATIENT NAME:  Amber JacobsonJUSTICE, Amber H MR#:  [id number] DATE OF BIRTH:  Aug 11, 1950  DATE OF ADMISSION:  06/08/2013 DATE OF DISCHARGE:  06/09/2013  DISCHARGE DIAGNOSES: 1.  Sepsis from urinary tract infection.  2.  Carcinoma of lung with brain metastasis.  3.  Hypertension.  4.  Tobacco use disorder.   CHIEF COMPLAINT: Fever, headache, unsteadiness,   HISTORY OF PRESENT ILLNESS: Amber Ingram is a 64 year old female with a history of hypertension, was diagnosed with metastatic brain cancer and lung mass, presented to the ED complaining of fever. The patient also states that she spiked a temperature of 101 and had recently undergone 15 rounds of radiation therapy for metastatic brain cancer and received her first chemotherapy last week. Chest x-ray did not show any acute abnormalities. Urinalysis was positive for urinary tract infection with positive leukocyte esterase and nitrites. CT scan of the head done in the ED showed metastatic brain disease with no new lesions, no enlargement of existing lesions. The patient does continue to smoke about 3 cigarettes a day.   PAST MEDICAL HISTORY: Significant for hypertension, lung carcinoma with brain mets, cerebral edema.   PAST SURGICAL HISTORY: Significant for partial hysterectomy.   HOSPITAL COURSE: Please see H and P other details. The patient was admitted to [facility] and initially started on IV cefepime as well as vancomycin. Urine cultures grew more than 100,000 CFU of Enterobacter aerogenes that was sensitive to Levaquin. She was subsequently switched to p.o. Levaquin. She was seen in consultation by Dr. Lorre NickGittin, who advised that her dexamethasone be increased to 4 mg p.o. b.i.d. The patient symptomatically improved. Mental status also improved. Headaches are significantly better, and she was stable at the time of discharge.   DISCHARGE MEDICATIONS: The patient was discharged on the following medications: Levaquin 500 mg p.o. b.i.d. for 7 more days,  dexamethasone 4 mg p.o. b.i.d., trazodone 100 mg once a day, triamterene/HCTZ 37.5/25 one tablet once a day, KCl 20 mEq once a day, folic acid 1 tablet once a day, and simvastatin 40 mg once a day.   She has been advised to follow up with Dr. Dareen PianoAnderson and also follow up with Dr. Doylene Canninghoksi in 1 to 2 weeks' time.   TOTAL TIME SPENT ON DISCHARGING THIS PATIENT: 35 minutes.   ____________________________ Barbette ReichmannVishwanath Darcy Cordner, MD vh:jcm D: 06/10/2013 13:04:45 ET T: 06/10/2013 13:26:56 ET JOB#: [id number]  cc: Barbette ReichmannVishwanath [place] Gustafson, MD, <Dictator> Barbette ReichmannVISHWANATH Gale Hulse MD ELECTRONICALLY SIGNED 06/25/2013 13:30

## 2014-05-18 NOTE — H&P (Signed)
PATIENT NAME:  Amber Ingram, Amber Ingram MR#:  [id number] DATE OF BIRTH:  05-Sep-1950  DATE OF ADMISSION:  02/14/2014  CHIEF COMPLAINT:  The patient is a 64 year old woman with metastatic adenocarcinoma of the lung, who was admitted from the medical oncology clinic with progressive weakness, fatigue, dehydration, and renal insufficiency.    HISTORY OF PRESENT ILLNESS:  The patient has metastatic adenocarcinoma of the lung.  Initial PET scan was positive in the left upper lobe, left hilar adenopathy and left axillary node.  Biopsy confirmed adenocarcinoma of her lung.  The tumor was EGFR, ALK, and ROS negative.  Head MRI revealed multiple areas of metastatic disease.   The patient received whole brain radiation.  She subsequently received carboplatin, Alimta, and Avastin from 06/29/2013 until 10/04/2013.  Followup PET scan on 10/09/2013 revealed a mixed response with decreased left upper lobe mass and nodal metastatic disease, new left external iliac node, decreased left adrenal metastasis, and a mixed response in bone (questionable new T10 vertebral lesion), most bone lesions had decrease in hypermetabolic activity.  There was a suspicion of development of a right lower lobe pulmonary nodule.    The patient began nivolumab (Opdivo) in 11/10/2013.  According to the patient's daughter she has received 7-8 cycles (every 2 weeks).  Her last treatment was on 02/06/2014.    The patient was seen in medical oncology clinic on 02/03/2014 by Dr. Oliva Bustard, her primary oncologist.  At that time she had symptoms suggestive of a urinary tract infection.  She was given a prescription for ciprofloxacin.  In addition to her nivolumab she was given IV fluids.  She noted general weakness and a decreased appetite and abdominal discomfort with some constipation.    Over the past 1-2 weeks she has progressively become weaker and more fatigued.  Her daughter notes that she fell several times (out of bed, fell out from the shower, and  fell over her walker).  She never hit her head.  She has poor balance which has become progressive over the past year especially with her increased generalized weakness. She has taken very little by mouth and notes "only sips of liquid".  She does not like Ensure or Boost.    The patient has taken Decadron 4 mg a day since last weekend.  This has not helped. She is taking her Megace.  She denies any pain except in her right leg.    She has had imaging studies in the past month.  Head MRI on 01/08/2014 revealed improvement in multiple metastatic sites. There was signal abnormality consistent with chemotherapy and radiation.  She had a new lesion at the C2 vertebral body.    Bone scan on 01/20/2014 revealed abnormal uptake at T10, medial right scapula, bilateral ribs, right inferior pubic ramus, left acetabulum, and proximal right femur.    PAST MEDICAL HISTORY:  Stage IV adenocarcinoma of the lung, hypertension.    PAST SURGICAL HISTORY:  Partial hysterectomy, lung biopsy, Port-A-Cath placement, basal cell carcinoma of the face removed several years ago.    FAMILY HISTORY:  Father had prostate and lung cancer.    SOCIAL HISTORY:  The patient is accompanied today by her daughter and grandson.  She previously smoked a third pack a day for 40 years.    MEDICATIONS:  Home medications include fluconazole 100 mg a day, Megace 400 mg twice a day, Decadron 4 mg a day, MiraLAX 17 grams once a day, Magic Mouthwash 5 mL q. 6 hours p.r.n., ondansetron 4 mg p.o. q.  8 hours p.r.n., Anucort hydrocortisone 25 mg suppositories p.r.n., lactulose p.r.n., folic acid 1 mg a day, omeprazole 20 mg a day, trazodone 100 mg at bedtime, simvastatin 40 mg a day, senna 1 tab twice daily, potassium 20 mEq a day.    ALLERGIES:  PENICILLIN.    REVIEW OF SYSTEMS:   CONSTITUTIONAL:  Progressive fatigue over the past 1-2 weeks. No fever, chills, or sweats.   EYES:  Poor vision, but not worse since cranial radiation. No diplopia.   EARS, NOSE, AND THROAT: No mouth sores or tenderness.  No sore throat.  Runny nose.  Brief epistaxis the other day.   PULMONARY:  No shortness of breath or cough.   CARDIAC:  No chest pain, palpitations, or orthopnea.  GASTROINTESTINAL:  Poor appetite.  No nausea, vomiting, melena, or hematochezia.   MUSCULOSKELETAL:  Pain in right leg.  No other bone pain.   GENITOURINARY: No dysuria or hematuria, decreased urine output.  ENDOCRINE:  No diabetes or thyroid disease.   SKIN:  No rashes or ulcers.   PSYCHIATRIC:  No mood changes.   HEMATOLOGIC AND LYMPHATIC:  No increased bruising or bleeding.  No tender or swollen lymph nodes.   NEUROLOGIC:  General weakness.  No focal weakness. Poor balance and coordination progressive over the past year.    PHYSICAL EXAMINATION:   GENERAL:  The patient is lying comfortably on the medical unit, in no acute distress. She appears fatigued.  VITAL SIGNS:  Height 64 inches, weight 145.9 pounds.  Pulse 116, respiratory rate 16, blood pressure 146/91, temperature 96.3, O2 saturation 100%.   HEAD:  Short gray hair.  Face symmetric.  No cushingoid features.   EYES: Blue. Pupils are equal, round, and reactive to light and accommodation.  No conjunctivitis or scleral icterus.   EARS, NOSE, AND THROAT:  No oral lesions.  No thrush.  NECK: Supple without adenopathy.   CARDIOVASCULAR:  Regular rate and rhythm without murmur, rub, or gallops.  LUNGS:  Clear to auscultation without rales, wheezes, or rhonchi. CHEST:  Right sided port-a-cath, unremarkable.  ABDOMEN:  Soft, nontender, with active bowel sounds and no hepatosplenomegaly.  LYMPH NODES:  No palpable cervical, supraclavicular, axillary, or inguinal adenopathy.   EXTREMITIES: No edema.   NEUROLOGIC:  Alert and oriented x 3.  Cranial nerves II through XII intact.  Motor strength symmetric.  Sensation symmetric.  No clonus or Babinski.    DATA REVIEWED:  Clinic notes were reviewed.  Imaging studies were  reviewed.  Laboratories today include a hematocrit of 37.6, hemoglobin 11.6, platelets 404,000, white count 14,600, with an ANC of 13,000.  Comprehensive metabolic panel includes a sodium of 141, potassium 4.8, chloride 111, bicarbonate 19, BUN 32, creatinine 1.7.  Protein 7.6, albumin 2.8, bilirubin 0.4, alkaline phosphatase 252, AST 32, ALT 22.  Prior creatinine on January 18 was 1.38.  Prior alkaline phosphatase was 173.    ASSESSMENT:  The patient is a 64 year old woman with metastatic adenocarcinoma of the lung, who was admitted from the medical oncology clinic with progressive weakness, fatigue, dehydration, and renal insufficiency.    PLAN:   1.  Begin IV gentle hydration with normal saline.   2.  Continue Decadron 4 mg a day.   3.  Nutrition consult.   4.  Physical therapy consult.   5.  Palliative care consult.   6.  Code status discussed with patient.  The patient is adamant that she does not want heroic measures.  Code status is DNR/DNI.  ____________________________ Drue Second Mike Gip, MD mcc:bu D: 02/14/2014 18:41:08 ET T: 02/14/2014 19:52:13 ET JOB#: [id number]  cc: Melissa C. Mike Gip, MD, <Dictator> Lequita Asal MD ELECTRONICALLY SIGNED 02/15/2014 11:23

## 2014-05-18 NOTE — Discharge Summary (Signed)
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  She subsequently received carboplatin, Alimta, and Avastin from 06/29/2013 until 10/04/2013.  Followup PET scan on 10/09/2013 revealed a mixed response with decreased left upper lobe mass and nodal metastatic disease, new left external iliac node, decreased left adrenal metastasis, and a mixed response in bone (questionable new T10 vertebral lesion), most bone lesions had decrease in hypermetabolic activity.  There was a suspicion of development of a right lower lobe pulmonary nodule.    The patient began nivolumab (Opdivo) in 11/10/2013.  According to the patient's daughter she has received 7-8 cycles (every 2 weeks).  Her last treatment was on 02/06/2014.    The patient was seen in medical oncology clinic on 02/03/2014 by Dr. Oliva Bustard, her primary oncologist.  At that time she had symptoms suggestive of a urinary tract infection.  She was given a prescription for ciprofloxacin.  In addition to her nivolumab she was given IV fluids.  She noted general weakness and a decreased appetite and abdominal discomfort with some constipation.    Over the past 1-2 weeks she has progressively become weaker and more fatigued.  Her daughter notes that she fell several times (out of bed, fell out from the shower, and fell over her walker).  She never hit her head.  She has poor balance which has become progressive over the past year especially with her increased generalized weakness. She has taken very little by mouth and notes "only sips of liquid".  She does not like  Ensure or Boost.   Allergies:  Penicillin: Rash    Thyroid:  01-Feb-16 05:37   Thyroxine, Free 0.93 (Result(s) reported on 17 Feb 2014 at [facility].)  Hepatic:  01-Feb-16 22:05   Bilirubin, Total 0.2  Alkaline Phosphatase  188  SGPT (ALT) 22  SGOT (AST) 28  Total Protein, Serum  5.6  Albumin, Serum  2.0  Routine Chem:  01-Feb-16 05:37   Result Comment LABS - This specimen was collected through an   - indwelling catheter or arterial line.  - A minimum of 48ms of blood was wasted prior    - to collecting the sample.  Interpret  - results with caution.  Result(s) reported on 17 Feb 2014 at 0[facility]  Glucose, Serum 97  BUN  28  Creatinine (comp) 1.23  Sodium, Serum 145  Potassium, Serum 4.6  Chloride, Serum  114  CO2, Serum 22  Calcium (Total), Serum  8.2  Osmolality (calc) 294  eGFR (African American)  57  eGFR (Non-African American)  47 (eGFR values <610mmin/1.73 m2 may be an indication of chronic kidney disease (CKD). Calculated eGFR, using the MRDR Study equation, is useful in  patients with stable renal function. The eGFR calculation will not be reliable in acutely ill patients when serum creatinine is changing rapidly. It is not useful in patients on dialysis. The eGFR calculation may not be applicable to patients at the low and high extremes of body sizes, pregnant women, and vegetarians.)  Anion Gap 9    22:05   Glucose, Serum  141  BUN  33  Creatinine (comp) 1.29  Sodium, Serum 141  Potassium, Serum 4.4  Chloride, Serum  110  CO2, Serum  20  Calcium (Total), Serum  8.3  Osmolality (calc) 291  eGFR (African American)  54  eGFR (Non-African American)  45 (eGFR values <18m/min/1.73 m2 may be an indication of chronic kidney disease (CKD). Calculated eGFR, using the MRDR Study equation, is useful in  patients with stable renal function. The eGFR calculation will not be reliable in acutely ill patients when serum creatinine is changing rapidly. It is not  useful in patients on dialysis. The eGFR calculation may not be applicable to patients at the low and high extremes of body sizes, pregnant women, and vegetarians.)  Anion Gap 11  02-Feb-16 05:42   Result Comment DIFFERENTIAL - SMEAR SCANNED  Result(s) reported on 18 Feb 2014 at 06:37AM.  Routine Hem:  02-Feb-16 05:42   WBC (CBC)  13.0  RBC (CBC)  3.44  Hemoglobin (CBC)  9.5  Hematocrit (CBC)  30.3  Platelet Count (CBC) 280  MCV 88  MCH 27.6  MCHC  31.3  RDW  17.6  Neutrophil % 85.1  Lymphocyte % 7.4  Monocyte % 6.8  Eosinophil % 0.0  Basophil % 0.7  Neutrophil #  11.1  Lymphocyte # 1.0  Monocyte # 0.9  Eosinophil # 0.0  Basophil # 0.1   PERTINENT RADIOLOGY STUDIES: XRay:    16-Nov-15 16:21, Chest PA and Lateral  Chest PA and Lateral   REASON FOR EXAM:    shob  COMMENTS:       PROCEDURE: DXR - DXR CHEST PA (OR AP) AND LATERAL  - Dec 02 2013  4:21PM     CLINICAL DATA:  Shortness of breath.    EXAM:  CHEST  2 VIEW    COMPARISON:  Chest radiograph of Jun 07, 2013 ; PET scan of  October 09, 2013.    FINDINGS:  The heart size and mediastinal contours are within normal limits. No  pneumothorax or pleural effusion is noted. Right internal jugular  Port-A-Cath is unchanged in position with distal tip in expected  position of the SVC.Right lung is clear. Faint density is seen in  the left upper lobe which corresponds in position to area of  posterior calcified pleural thickening identified on prior PET scan.  No acute pulmonary disease is noted. Degenerative changes seen  involving the acromioclavicular joints bilaterally.     IMPRESSION:  No acute cardiopulmonary abnormality seen.      Electronically Signed    By: JSabino DickM.D.    On: 12/03/2013 08:39     Verified By: JMarveen Reeks M.D.,    30-Jan-16 11:39, Femur Right  Femur Right   REASON FOR EXAM:    metastatic lung cancer; pain right leg; bone scan   abnormal right proximal femur;  COMMENTS:    LMP: Post-Menopausal    PROCEDURE: DXR - DXR FEMUR RIGHT  - Feb 15 2014 11:39AM     CLINICAL DATA:  metastatic lung ca with abnormal bone scan done  01/20/14 pt has distal rt femur pain    EXAM:  RIGHT FEMUR - 2 VIEW    COMPARISON:  None.    FINDINGS:  There is no evidence of fracture or dislocation. Sclerotic 144m focus in the proximal femoral shaft laterally, corresponding to a  focus of mildly increased activity on recent scintigraphy. No  orthopedically significant lytic lucency. No significant osseous  degenerative change. Soft tissues are unremarkable.     IMPRESSION:  1. Negative for fracture, dislocation,  or orthopedically significant  lesion.      Electronically Signed    By: Arne Cleveland M.D.    On: 02/15/2014 11:47     Verified By: Kandis Cocking, M.D.,  [place]:  PACS Image     31-Jan-16 12:46, CT Head Without Contrast  PACS Image     02-Feb-16 10:58, PET/CT Scan Lung Cancer Restaging  PACS Image   MRI:    23-Dec-15 16:49, MRI Brain  With/Without Contrast  MRI Brain  With/Without Contrast   REASON FOR EXAM:    lung cancer headaches  mets to brain  COMMENTS:       PROCEDURE: MR  - MR BRAIN WO/W CONTRAST  - Jan 08 2014  4:49PM     CLINICAL DATA:  Metastatic lung cancer.    EXAM:  MRI HEAD WITHOUT AND WITH CONTRAST    TECHNIQUE:  Multiplanar, multiecho pulse sequences of the brain and surrounding  structures were obtained without and with intravenous contrast.    CONTRAST:  14 mL MultiHance IV  COMPARISON:  MRI 07/17/2013    FINDINGS:  Multiple enhancing metastatic lesions have improved in the interval.    Right superior cerebellar vermis lesion measures 8 x 7 mm. 3 mm and  4 mm right cerebellar hemisphere lesions are improved. Small left  cerebellar lesion no longer visualized. Small left temporal lobe  lesion no longer visualized. Right frontal lobe lesion has improved  and is barely perceptible measuring approximately 3 mm.    New  lesion in the C2 vertebral body on the left which may represent  metastatic disease.    No new lesions.  No hemorrhagic lesions.  Generalized atrophy is mild. Significant progression of diffuse  white matter hyperintensity consistent with chemo and radiation .  Hyperintensity in the pons bilaterally also has progressed and  likely is due to radiation change. No hydrocephalus.    Negative for acute infarct.     IMPRESSION:  Interval improvement in multiple metastatic deposits. No new or  enlarging lesions    Marked progression of signal abnormality throughout the cerebral  white matter and brainstem consistent with chemo and radiation.    Suspect new bone lesion in the C2 vertebral body on the left.  Electronically Signed    By: Franchot Gallo M.D.    On: 01/08/2014 17:32         Verified By: Truett Perna, M.D.,  CT:    31-Jan-16 12:46, CT Head Without Contrast  CT Head Without Contrast   REASON FOR EXAM:    altered mental status; h/o brain mets; falls; r/o   bleed  COMMENTS:       PROCEDURE: CT  - CT HEAD WITHOUT CONTRAST  - Feb 16 2014 12:46PM     CLINICAL DATA:  Altered mental status. Frontal headache. Personal  history of lung cancer with brain metastases. Previous treatment  with radiation.    EXAM:  CT HEAD WITHOUT CONTRAST    TECHNIQUE:  Contiguous axial images were obtained from the base of the skull  through the vertex without intravenous contrast.    COMPARISON:  MRI 01/08/2014.  CT 06/07/2013.    FINDINGS:  There is low-density throughout the white matter consistent with  changes secondary to brain radiation. There is no definable mass or  mass effect. Ventricular size is unchanged from previous studies. No  hemorrhage. No extra-axial collection. No calvarial abnormality.     IMPRESSION:  Low-density throughout the white matter consistent with previous  cranial  radiation. No definable mass or mass effect on this  noncontrast brain study. Contrast  MRI and/or chronic contrast CT  would be better able to evaluate the small residual lesions seen on  the last MRI study of 01/08/2014.      Electronically Signed    By: Nelson Chimes M.D.    On: 02/16/2014 15:33         Verified By: Jules Schick, M.D.,  Nuclear Med:    04-Jan-16 14:15, Bone Scan Whole Body (Part 2 of 2)  Bone Scan Whole Body (Part 2 of 2)   REASON FOR EXAM:    posterior HA bone mets in cervical spine  COMMENTS:       PROCEDURE: NM  - NM BONE WB 3 HR 2 OF 2  - Jan 20 2014  2:15PM     CLINICAL DATA:  Known metastatic prostate malignancy    EXAM:  NUCLEAR MEDICINE WHOLE BODY BONE SCAN    TECHNIQUE:  Whole body anterior and posterior images were obtained approximately  3 hours after intravenous injection of radiopharmaceutical.    RADIOPHARMACEUTICALS:  23.44  mCi Technetium-99 MDP  COMPARISON:  PET-CT study of October 09, 2013    FINDINGS:  There is adequate uptake of the radiopharmaceutical by the skeleton.  Adequate soft tissue clearance and renal activity is demonstrated.    Uptake over the calvarium and upper extremities is normal. There is  a tiny focus of increased uptake over the superior medial aspect of  the right scapula. There is abnormally increased uptake in the  posterior lateral aspects of the left 5th and 6th ribs and in the  right 8th rib posteriorly. There is subtle increased uptake in the  body of T10 whichcorresponds to abnormality noted on the PET-CT  study peer    Uptake over the sternum and spine is not suspicious for malignancy.  Activity over the pelvis is abnormal in the inferior pubic ramus on  the right and along the acetabulum on the left.    There is mildly increased uptake in the proximal right femoral  shaft. There is increased uptake associated with the left knee that  is likely degenerative. This is most intense in the patella at along  the periphery of the lateral femoral condyle.     IMPRESSION:  1. There is  abnormal uptake within the body of T10, medial aspect of  the right scapula, bilateral ribs, right inferior pubic ramus, the  left acetabulum, and in the proximal right femur which is worrisome  for metastatic disease.  2. Increased uptake associated with the patella and lateral femoral  condyles is likely degenerative.  Plain x-rays of the right femur and bony pelvis and left knee are  recommended.      Electronically Signed    By: David  Martinique    On: 01/20/2014 15:49        Verified By: DAVID A. [place], M.D., MD    02-Feb-16 10:58, PET/CT Scan Lung Cancer Restaging  PET/CT Scan Lung Cancer Restaging   REASON FOR EXAM:    weakness . abnormal liver enzymes  COMMENTS:       PROCEDURE: PET - PET/CT RESTAGING LUNG CA  - Feb 18 2014 10:58AM     CLINICAL DATA:  Subsequent treatment strategy for lung carcinoma.  Stage IV lung cancer.Marland Kitchen    EXAM:  NUCLEAR MEDICINE PET SKULL BASE TO THIGH    TECHNIQUE:  11.8 mCi F-18 FDG was injected intravenously. Full-ring PET  imaging  was performed from the skull base to thigh after the radiotracer. CT  data was obtained and used for attenuation correction and anatomic  localization.    FASTING BLOOD GLUCOSE:  Value: 95 mg/dl    COMPARISON:  PET-CT scan 10/09/2013    FINDINGS:  NECK    No hypermetabolic lymph nodes in the neck.    CHEST    Increase in size and metabolic activity of left upper lobe nodule  measuring17.5 mm with SUV max 8.7. This is increased from 15 mm  with SUV max 7.2.    There new hypermetabolic left and right axillary lymph nodes.  Example node on the right with SUV max equal 12.1 on image 70.    Some improvement in hypermetabolic activity in AP window adenopathy.  Subcarinal adenopathy slightly increased.    ABDOMEN/PELVIS    There is new widespread multifocal liver metastasis. Approximately  40 lesions within the liver. Example lesions in the right hepatic  lobe with SUV max equals 16.5.    Multiple new  hypermetabolic spleen lesions with individual example  lesion SUV max equal 20.1    Increased in metabolic activity of bilateral adrenal metastasis.  Left adrenal gland lesion with SUV max equal 18 increased 9.2. These  lesions areseen is hypodense lesions on CT portion.    There new hypermetabolic lymph nodes within the central mesentery  (image 162) for example.    New hypermetabolic external iliac lymph nodes. Interval enlargement  of hypermetabolic masses which likely represents peritoneal implant  left adjacent to liver. This mass measures 3 cm with intense  metabolic activity and is increased from 2 cm on prior.    SKELETON  Multiple foci of skeletal metastasis throughout the femurs, pelvis,  spine, ribs, and cervical spine. Example lesion in the central  sacrum with SUV max 18.1 increased from 9.4.     IMPRESSION:  1. Marked progression of lung cancer metastasis.  2. New multifocal hypermetabolic hepatic metastasis.  3. New hypermetabolic splenic metastasis.  4.Increase and metabolic activity in the axillary nodes,  retroperitoneal nodes and mesenteric nodes and iliac nodes.  5. Interval increase in metabolic activity of multifocal skeletal  metastasis.  6. Interval increase in size and metabolic activity of left upper  lobe primary neoplasm  7. Interval increase in metabolic activity of adrenal metastasis as  well as increase in size.      Electronically Signed    By: Suzy Bouchard M.D.    On: 02/18/2014 11:44         Verified By: Rennis Golden, M.D.,   Pertinent Past History:  Pertinent Past History PAST MEDICAL HISTORY:  Stage IV adenocarcinoma of the lung, hypertension.    PAST SURGICAL HISTORY:  Partial hysterectomy, lung biopsy, Port-A-Cath placement, basal cell carcinoma of the face removed several years ago.    FAMILY HISTORY:  Father had prostate and lung cancer.    SOCIAL HISTORY:  The patient is accompanied today by her daughter and grandson.   She previously smoked a third pack a day for 40 years   Hospital Course:  Hospital Course During hospital stay patient underwent hydration and supportive therapy without significant change in patient's performance status.  General condition was declining.  CT scan of brain did not reveal any evidence of progressive disease.  PET scan was done which revealed progression of disease in the liver lymph node and bone  I had prolong discussion with patient's family and options of hospice home versus home  hospice at been discuss.  Patient's strong family desires to go home.  40 catheter was inserted for end-of-life care.  Patient was started on Decadron.  And patient was discharged was advised to get follow-up   Condition on Discharge Poor   Code Status:  Code Status No Code/Do Not Resuscitate   DISCHARGE INSTRUCTIONS HOME MEDS:  Medication Reconciliation: Patient's Home Medications at Discharge:     Medication Instructions  trazodone 100 mg oral tablet  1 tab(s) orally once a day (at bedtime)   lactulose 10 g/15 ml oral syrup  15 - 30 milliliter(s) orally once a day, As Needed - for Constipation   senna s 50 mg-8.6 mg oral tablet  1 tab(s) orally 2 times a day   miralax  17 gram(s) orally once a day   dexamethasone 4 mg oral tablet  2 dose(s) orally once a day   omeprazole 20 mg oral delayed release capsule  1 cap(s) orally 2 times a day   oxycodone 10 mg oral tablet  1 tab(s) orally every 2 to 3 hours, As Needed, severe pain (7-10/10)   alprazolam 0.5 mg oral tablet  1 tab(s) orally every 6 hours, As Needed   fentanyl 12 mcg/hr transdermal film, extended release  1 patch transdermal every 72 hours   oxygen 2l via [place]  1 application     hospital bed with rail  1      lisinopril 10 mg oral tablet  1 tab(s) orally once a day    STOP TAKING THE FOLLOWING MEDICATION(S):    simvastatin 40 mg oral tablet: 1 tab(s) orally once a day (at bedtime) ondansetron 4 mg oral tablet: 1 tab(s) orally  every 8 hours, As Needed. Comments:  megestrol 40 mg/ml oral suspension: 10 milliliter(s) orally 2 times a day. Comments:  potassium chloride 20 meq oral tablet, extended release: 1 tab(s) orally 2 times a day  Physician's Instructions:  Home Health? No   Treatments None   Home Oxygen? Yes   Oxygen delivery at home: 3L   Diet Regular   Dietary Supplements None   Activity Limitations As tolerated   Referrals hospice   Return to Work Not Applicable   Time frame for Follow Up Appointment as needed   Other Comments case discussed  with  the family, Education officer, museum, hospice nurse.   TIME SPENT:  Total Time: Greater than 30 minutes   Electronic Signatures: Laverda Stribling, Martie Lee (MD)  (Signed 03-Feb-16 22:41)  Authored: ADMISSION DATE AND DIAGNOSIS, CHIEF COMPLAINT/HPI, Allergies, PERTINENT LABS, PERTINENT RADIOLOGY STUDIES, PERTINENT PAST HISTORY, HOSPITAL COURSE, DISCHARGE INSTRUCTIONS HOME MEDS, PATIENT INSTRUCTIONS, TIME SPENT   Last Updated: 03-Feb-16 22:41 by Jobe Gibbon (MD)

## 2015-09-29 IMAGING — CT CT HEAD WITHOUT CONTRAST
2 of 3 series · 15 of 30 positions shown, 17 images · non-contrast
Comparison: MRI 01/08/2014.  CT 06/07/2013.

CLINICAL DATA: Altered mental status. Frontal headache. Personal
history of lung cancer with brain metastases. Previous treatment
with radiation.

EXAM:
CT HEAD WITHOUT CONTRAST
TECHNIQUE: Contiguous axial images were obtained from the base of the skull
through the vertex without intravenous contrast.

[Series 2: soft tissue · axial · 0.42mm/px · z∈[+554,+660]mm · 8 of 29 slices shown, 10 images]
[im 4/29  brain]
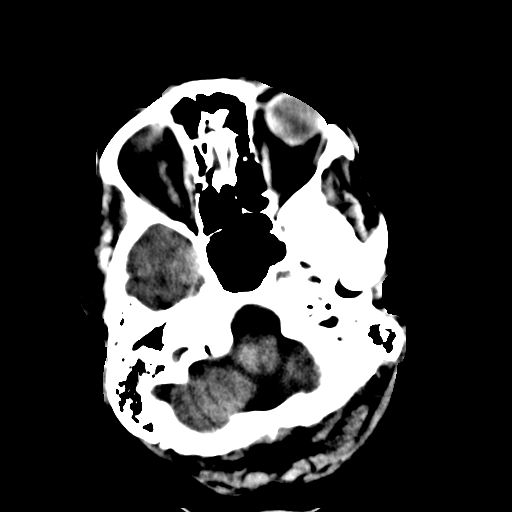
[im 4/29  bone]
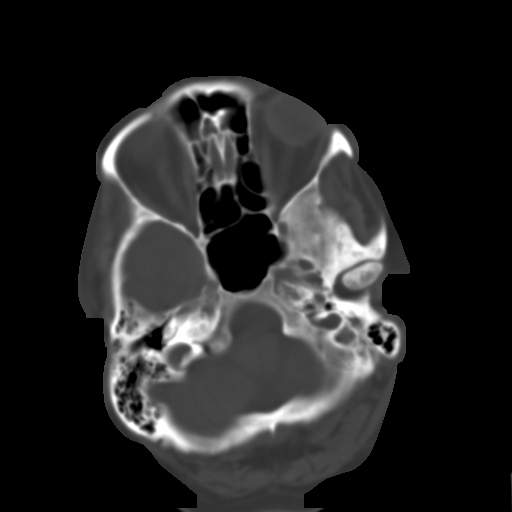
[im 7/29  brain]
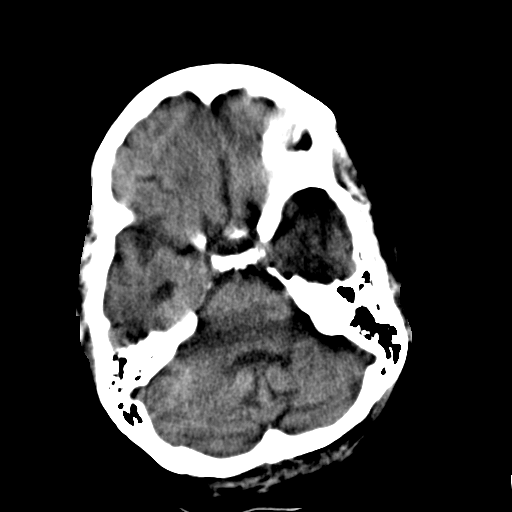
[im 10/29  brain]
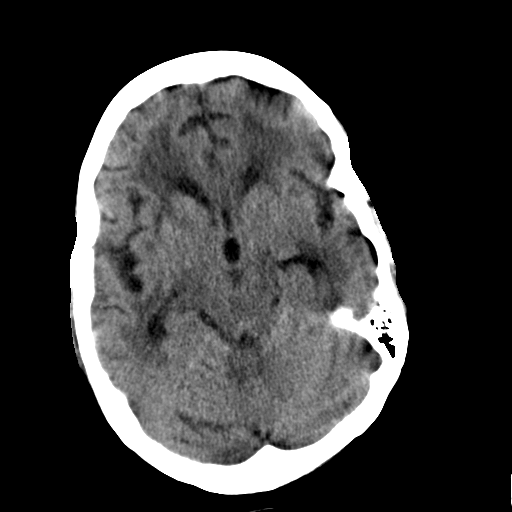
[im 13/29  brain]
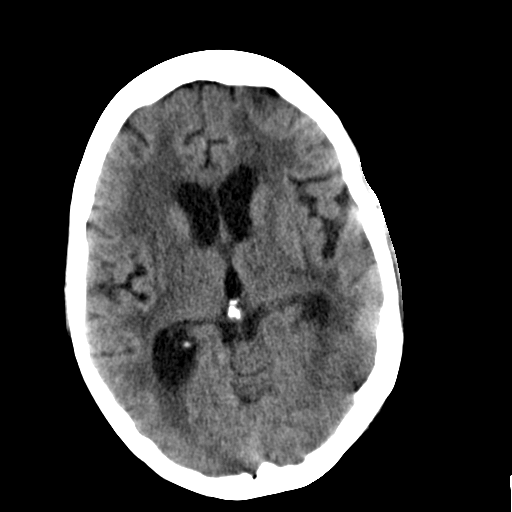
[im 16/29  brain]
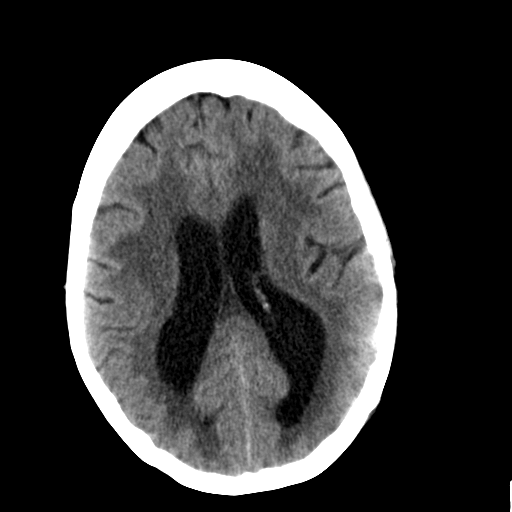
[im 16/29  bone]
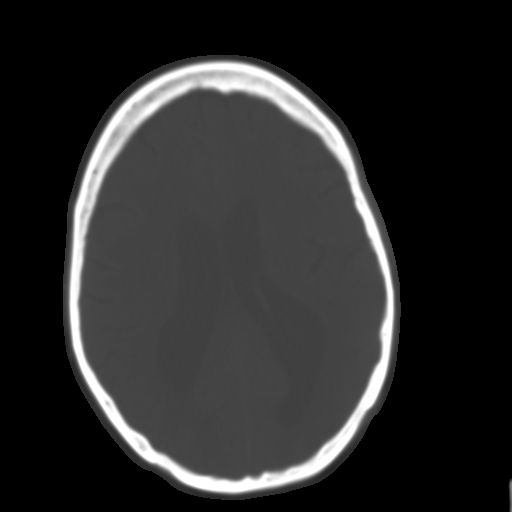
[im 19/29  brain]
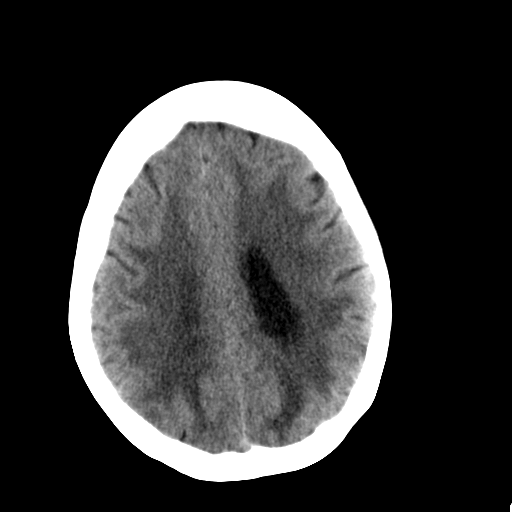
[im 22/29  brain]
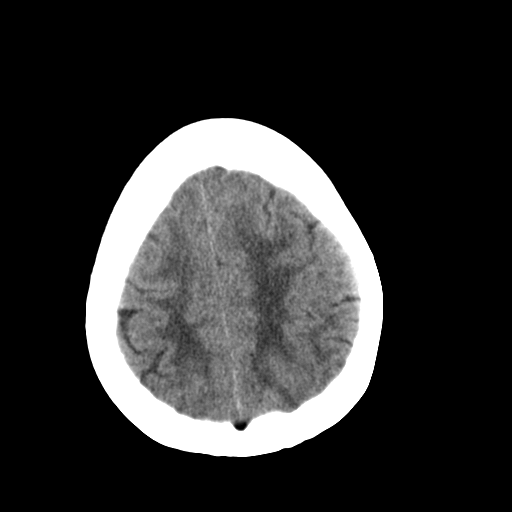
[im 25/29  brain]
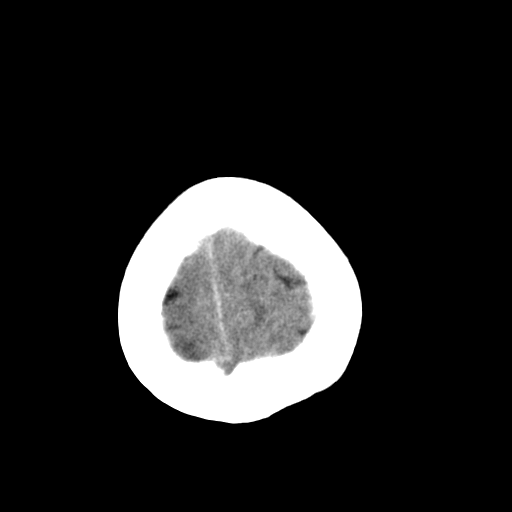

[Series 5: bone--- · axial · 0.34mm/px · z∈[+554,+644]mm · 7 of 28 slices shown]
[im 4/28  bone]
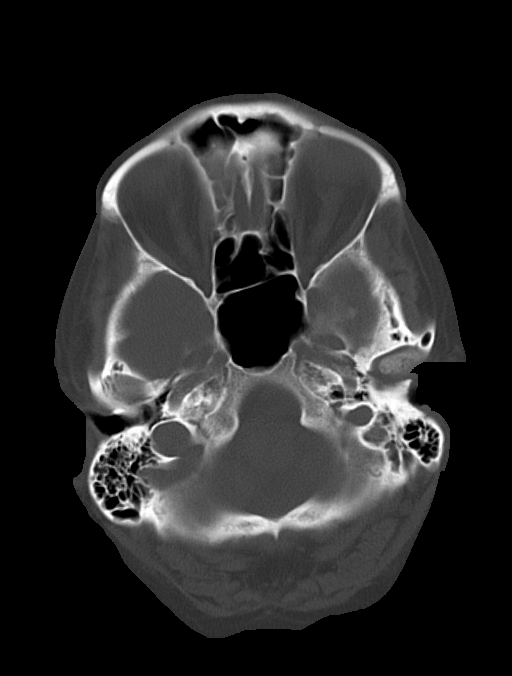
[im 7/28  bone]
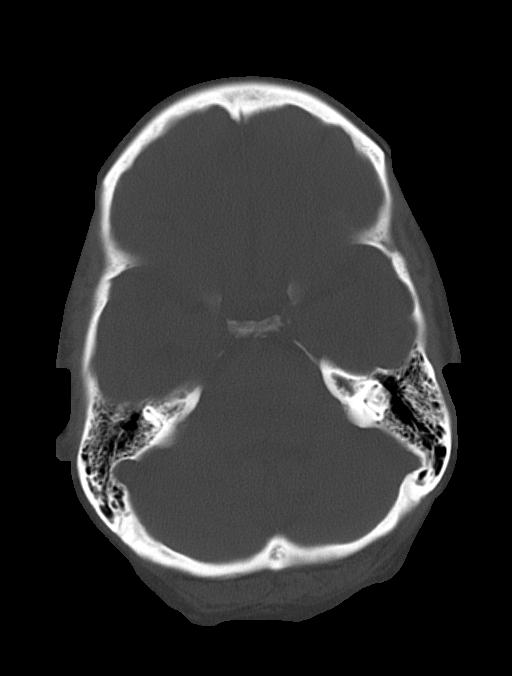
[im 10/28  bone]
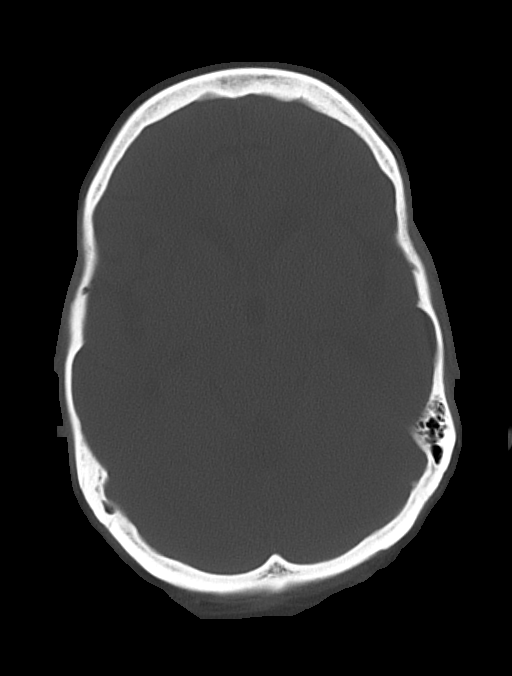
[im 13/28  bone]
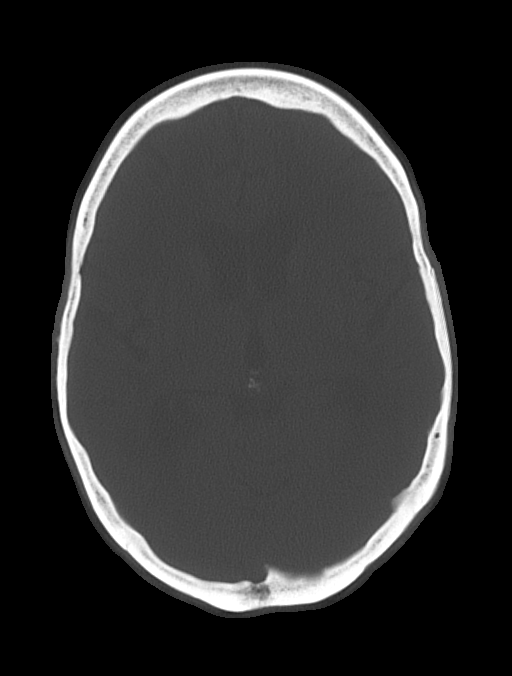
[im 16/28  bone]
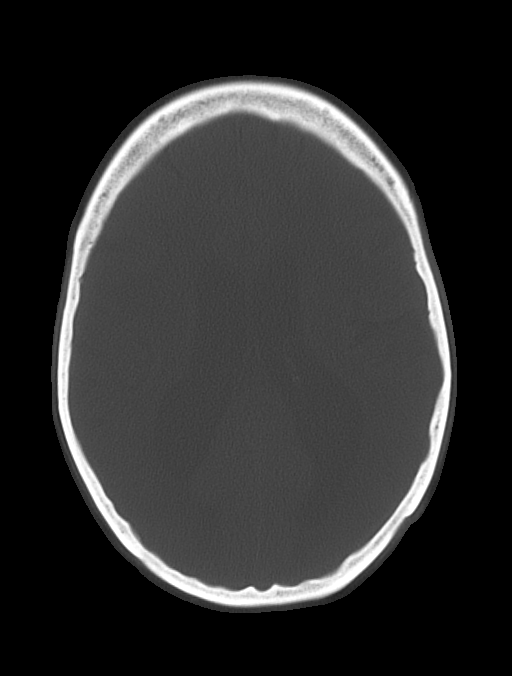
[im 19/28  bone]
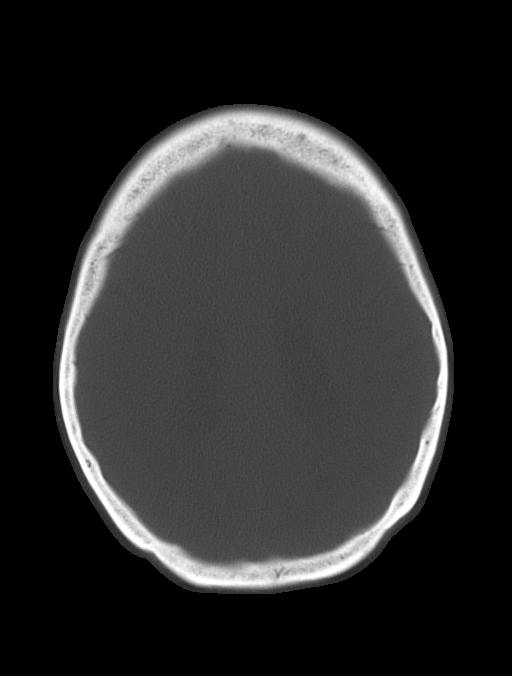
[im 22/28  bone]
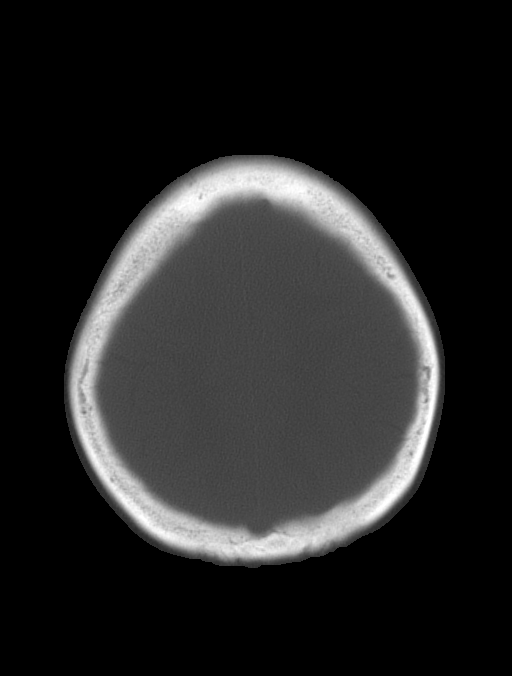

[15 of 30 positions shown; findings below may reference images not displayed]

FINDINGS: There is low-density throughout the white matter consistent with
changes secondary to brain radiation. There is no definable mass or
mass effect. Ventricular size is unchanged from previous studies. No
hemorrhage. No extra-axial collection. No calvarial abnormality.
IMPRESSION: Low-density throughout the white matter consistent with previous
cranial radiation. No definable mass or mass effect on this
noncontrast brain study. Contrast MRI and/or chronic contrast CT
would be better able to evaluate the small residual lesions seen on
the last MRI study of 01/08/2014.
